# Patient Record
Sex: Female | Born: 1990 | Race: Black or African American | Hispanic: No | Marital: Single | Smoking: Never smoker
Health system: Southern US, Community
[De-identification: ages and names within clinical notes are randomized; demographics above are authoritative.]

## PROBLEM LIST (undated history)

## (undated) ENCOUNTER — Inpatient Hospital Stay (HOSPITAL_COMMUNITY): Payer: Self-pay

## (undated) DIAGNOSIS — J329 Chronic sinusitis, unspecified: Secondary | ICD-10-CM

## (undated) DIAGNOSIS — A749 Chlamydial infection, unspecified: Secondary | ICD-10-CM

## (undated) DIAGNOSIS — N12 Tubulo-interstitial nephritis, not specified as acute or chronic: Secondary | ICD-10-CM

## (undated) DIAGNOSIS — Z789 Other specified health status: Secondary | ICD-10-CM

## (undated) DIAGNOSIS — N390 Urinary tract infection, site not specified: Secondary | ICD-10-CM

## (undated) HISTORY — PX: NO PAST SURGERIES: SHX2092

---

## 2011-06-25 ENCOUNTER — Encounter: Payer: Self-pay | Admitting: Family Medicine

## 2011-06-25 ENCOUNTER — Emergency Department (HOSPITAL_BASED_OUTPATIENT_CLINIC_OR_DEPARTMENT_OTHER)
Admission: EM | Admit: 2011-06-25 | Discharge: 2011-06-25 | Disposition: A | Discharge status: Home / Self Care | Payer: Self-pay | Attending: Emergency Medicine | Admitting: Emergency Medicine

## 2011-06-25 DIAGNOSIS — J069 Acute upper respiratory infection, unspecified: Secondary | ICD-10-CM | POA: Insufficient documentation

## 2011-06-25 DIAGNOSIS — R51 Headache: Secondary | ICD-10-CM | POA: Insufficient documentation

## 2011-06-25 HISTORY — DX: Chronic sinusitis, unspecified: J32.9

## 2011-06-25 MED ORDER — CETIRIZINE-PSEUDOEPHEDRINE ER 5-120 MG PO TB12: 1.0000 | ORAL_TABLET | Freq: Two times a day (BID) | ORAL | Status: DC

## 2011-06-25 NOTE — ED Notes (Signed)
Pt. In no distress.

## 2011-06-25 NOTE — ED Notes (Signed)
Pt c/o "sinus pressure and burning" since Tuesday night. Pt sts she took tylenol sinus at "nasal spray" at home without relief. Pt reports h/o sinus infection.

## 2011-06-25 NOTE — ED Provider Notes (Signed)
History     CSN: [id number] Arrival date & time: 06/25/2011  3:32 PM  Chief Complaint  Patient presents with  . Facial Pain   HPI Comments: Pt states that she has taken tylenol sinus with mild relief  Patient is a 20 y.o. female presenting with URI. The history is provided by the patient. No language interpreter was used.  URI The primary symptoms include headaches, sore throat and cough. Primary symptoms do not include fever. The current episode started 2 days ago. This is a new problem.  Symptoms associated with the illness include facial pain, sinus pressure and congestion.    Past Medical History  Diagnosis Date  . Sinus infection     History reviewed. No pertinent past surgical history.  No family history on file.  History  Substance Use Topics  . Smoking status: Never Smoker   . Smokeless tobacco: Not on file  . Alcohol Use: No    OB History    Grav Para Term Preterm Abortions TAB SAB Ect Mult Living                  Review of Systems  Constitutional: Negative for fever.  HENT: Positive for congestion, sore throat and sinus pressure.   Respiratory: Positive for cough.   Cardiovascular: Negative.   Gastrointestinal: Negative.   Musculoskeletal: Negative.   Skin: Negative.   Neurological: Positive for headaches.    Physical Exam  BP 111/82  Pulse 103  Temp(Src) 98.1 F (36.7 C) (Oral)  Resp 16  Ht 5\' 5"  (1.651 m)  Wt 98 lb (44.453 kg)  BMI 16.31 kg/m2  SpO2 99%  Physical Exam  Nursing note and vitals reviewed. Constitutional: She appears well-developed and well-nourished.  HENT:  Head: Normocephalic and atraumatic.  Right Ear: Tympanic membrane normal.  Left Ear: Tympanic membrane normal.  Nose: Rhinorrhea present.  Mouth/Throat: Oropharynx is clear and moist.  Eyes: Pupils are equal, round, and reactive to light.  Neck: Normal range of motion. Neck supple.  Cardiovascular: Normal rate and regular rhythm.   Pulmonary/Chest: Effort normal and  breath sounds normal.  Musculoskeletal: Normal range of motion.  Neurological: She is alert.    ED Course  Procedures  MDM Will treat pt symptomatically   Medical screening examination/treatment/procedure(s) were performed by non-physician practitioner and as supervising physician I was immediately available for consultation/collaboration. Osvaldo Human, M.D.  Teressa Lower, NP 06/25/11 1543  Carleene Cooper III, MD 06/25/11 [phone]

## 2011-12-15 ENCOUNTER — Emergency Department (INDEPENDENT_AMBULATORY_CARE_PROVIDER_SITE_OTHER)
Admission: EM | Admit: 2011-12-15 | Discharge: 2011-12-15 | Disposition: A | Discharge status: Home / Self Care | Payer: Medicaid Other | Source: Home / Self Care | Attending: Emergency Medicine | Admitting: Emergency Medicine

## 2011-12-15 ENCOUNTER — Encounter (HOSPITAL_COMMUNITY): Payer: Self-pay | Admitting: Emergency Medicine

## 2011-12-15 DIAGNOSIS — N390 Urinary tract infection, site not specified: Secondary | ICD-10-CM

## 2011-12-15 HISTORY — DX: Chlamydial infection, unspecified: A74.9

## 2011-12-15 HISTORY — DX: Urinary tract infection, site not specified: N39.0

## 2011-12-15 HISTORY — DX: Tubulo-interstitial nephritis, not specified as acute or chronic: N12

## 2011-12-15 LAB — POCT URINALYSIS DIP (DEVICE)
Bilirubin Urine: NEGATIVE
Glucose, UA: NEGATIVE mg/dL
Ketones, ur: NEGATIVE mg/dL
Protein, ur: NEGATIVE mg/dL
Specific Gravity, Urine: 1.02 (ref 1.005–1.030)

## 2011-12-15 LAB — POCT PREGNANCY, URINE: Preg Test, Ur: NEGATIVE

## 2011-12-15 MED ORDER — SULFAMETHOXAZOLE-TRIMETHOPRIM 800-160 MG PO TABS: 1.0000 | ORAL_TABLET | Freq: Two times a day (BID) | ORAL | Status: AC

## 2011-12-15 MED ORDER — PHENAZOPYRIDINE HCL 200 MG PO TABS: 200.0000 mg | ORAL_TABLET | Freq: Three times a day (TID) | ORAL | Status: AC | PRN

## 2011-12-15 NOTE — Discharge Instructions (Signed)
Make sure you drink extra fluids. Return from sexual intercourse until your symptoms have resolved. Return to the ER if you've a fever above 100.4, if you do not get better after finishing the antibiotics, or for any other concerns.  Urinary Tract Infection Infections of the urinary tract can start in several places. A bladder infection (cystitis), a kidney infection (pyelonephritis), and a prostate infection (prostatitis) are different types of urinary tract infections (UTIs). They usually get better if treated with medicines (antibiotics) that kill germs. Take all the medicine until it is gone. You or your child may feel better in a few days, but TAKE ALL MEDICINE or the infection may not respond and may become more difficult to treat. HOME CARE INSTRUCTIONS   Drink enough water and fluids to keep the urine clear or pale yellow. Cranberry juice is especially recommended, in addition to large amounts of water.   Avoid caffeine, tea, and carbonated beverages. They tend to irritate the bladder.   Alcohol may irritate the prostate.   Only take over-the-counter or prescription medicines for pain, discomfort, or fever as directed by your caregiver.  To prevent further infections:  Empty the bladder often. Avoid holding urine for long periods of time.   After a bowel movement, women should cleanse from front to back. Use each tissue only once.   Empty the bladder before and after sexual intercourse.  FINDING OUT THE RESULTS OF YOUR TEST Not all test results are available during your visit. If your or your child's test results are not back during the visit, make an appointment with your caregiver to find out the results. Do not assume everything is normal if you have not heard from your caregiver or the medical facility. It is important for you to follow up on all test results. SEEK MEDICAL CARE IF:   There is back pain.   Your baby is older than 3 months with a rectal temperature of 100.5 F  (38.1 C) or higher for more than 1 day.   Your or your child's problems (symptoms) are no better in 3 days. Return sooner if you or your child is getting worse.  SEEK IMMEDIATE MEDICAL CARE IF:   There is severe back pain or lower abdominal pain.   You or your child develops chills.   You have a fever.   Your baby is older than 3 months with a rectal temperature of 102 F (38.9 C) or higher.   Your baby is 29 months old or younger with a rectal temperature of 100.4 F (38 C) or higher.   There is nausea or vomiting.   There is continued burning or discomfort with urination.  MAKE SURE YOU:   Understand these instructions.   Will watch your condition.   Will get help right away if you are not doing well or get worse.  Document Released: 07/29/2005 Document Revised: 07/01/2011 Document Reviewed: 03/03/2007 [facility] Patient Information 2012 [place], [place].

## 2011-12-15 NOTE — ED Notes (Signed)
C/o frequent urination and pressure and pain in lower back.  Onset of symptoms approx mid January, was taking azo and cranberry  juice.

## 2011-12-15 NOTE — ED Provider Notes (Signed)
History     CSN: [id number]  Arrival date & time 12/15/11  1701   First MD Initiated Contact with Patient 12/15/11 1904      Chief Complaint  Patient presents with  . Dysuria    (Consider location/radiation/quality/duration/timing/severity/associated sxs/prior treatment) HPI Comments: Patient with urinary urgency, frequency, oderous urine, for several weeks. Describes left-sided lower back pain starting several days ago. Has increased her fluids, and is taking azo without relief. No nausea, vomiting, vaginal bleeding, vaginal discharge, fevers. Patient sexually active with partner of 2 months, states that they use condoms on a consistent basis. STDs not a concern today-patient states that she was recently checked and "everything was negative". States this feels identical to previous episodes of UTIs. Has a history of Chlamydia. No history of gonorrhea, BV, trichomoniasis, herpes, HIV, syphilis.  ROS as noted in HPI. All other ROS negative.   Patient is a 21 y.o. female presenting with dysuria. The history is provided by the patient.  Dysuria  This is a new problem. The current episode started more than 2 days ago. The problem occurs every urination. The problem has been gradually worsening. There has been no fever. She is sexually active. There is a history of pyelonephritis. Associated symptoms include frequency and urgency. Pertinent negatives include no chills, no sweats, no nausea, no vomiting, no discharge, no hematuria, no hesitancy, no possible pregnancy and no flank pain. She has tried increased fluids for the symptoms.    Past Medical History  Diagnosis Date  . Sinus infection   . UTI (lower urinary tract infection)   . Pyelonephritis   . Chlamydia     History reviewed. No pertinent past surgical history.  History reviewed. No pertinent family history.  History  Substance Use Topics  . Smoking status: Never Smoker   . Smokeless tobacco: Not on file  . Alcohol Use: No     OB History    Grav Para Term Preterm Abortions TAB SAB Ect Mult Living                  Review of Systems  Constitutional: Negative for chills.  Gastrointestinal: Negative for nausea and vomiting.  Genitourinary: Positive for dysuria, urgency and frequency. Negative for hesitancy, hematuria and flank pain.    Allergies  Review of patient's allergies indicates no known allergies.  Home Medications   Current Outpatient Rx  Name Route Sig Dispense Refill  . AZO-CRANBERRY PO Oral Take by mouth.    Marland Kitchen PHENAZOPYRIDINE HCL 200 MG PO TABS Oral Take 1 tablet (200 mg total) by mouth 3 (three) times daily as needed for pain. 6 tablet 0  . SULFAMETHOXAZOLE-TRIMETHOPRIM 800-160 MG PO TABS Oral Take 1 tablet by mouth 2 (two) times daily. X 7 days 14 tablet 0    BP 109/70  Pulse 77  Temp(Src) 98.9 F (37.2 C) (Oral)  Resp 16  SpO2 100%  Physical Exam  Nursing note and vitals reviewed. Constitutional: She is oriented to person, place, and time. She appears well-developed and well-nourished. No distress.  HENT:  Head: Normocephalic and atraumatic.  Eyes: Conjunctivae and EOM are normal.  Neck: Normal range of motion.  Cardiovascular: Normal rate, regular rhythm and normal heart sounds.   Pulmonary/Chest: Effort normal and breath sounds normal.  Abdominal: Soft. Bowel sounds are normal. She exhibits no distension. There is no tenderness. There is no rebound.       Mild left-sided CVA tenderness. No flank, suprapubic tenderness.  Musculoskeletal: Normal range of motion.  Neurological: She is alert and oriented to person, place, and time.  Skin: Skin is warm and dry.  Psychiatric: She has a normal mood and affect. Her behavior is normal. Judgment and thought content normal.    ED Course  Procedures (including critical care time)  Labs Reviewed  POCT URINALYSIS DIP (DEVICE) - Abnormal; Notable for the following:    Urobilinogen, UA 2.0 (*)    Leukocytes, UA SMALL (*) Biochemical  Testing Only. Please order routine urinalysis from main lab if confirmatory testing is needed.   All other components within normal limits  POCT PREGNANCY, URINE   No results found.   1. UTI (lower urinary tract infection)     Results for orders placed during the hospital encounter of 12/15/11  POCT URINALYSIS DIP (DEVICE)      Component Value Range   Glucose, UA NEGATIVE  NEGATIVE (mg/dL)   Bilirubin Urine NEGATIVE  NEGATIVE    Ketones, ur NEGATIVE  NEGATIVE (mg/dL)   Specific Gravity, Urine 1.020  1.005 - 1.030    Hgb urine dipstick NEGATIVE  NEGATIVE    pH 7.0  5.0 - 8.0    Protein, ur NEGATIVE  NEGATIVE (mg/dL)   Urobilinogen, UA 2.0 (*) 0.0 - 1.0 (mg/dL)   Nitrite NEGATIVE  NEGATIVE    Leukocytes, UA SMALL (*) NEGATIVE   POCT PREGNANCY, URINE      Component Value Range   Preg Test, Ur NEGATIVE  NEGATIVE     MDM  H&P most c/w  UTI. Will send home  abx for uti - home with extended antibiotic course as patient has mild CVA tenderness, and history of pyelonephritis. Advised pt to refrain from sexual contact until  symptoms resolve.  Pt agrees.     Luiz Blare, MD 12/15/11 2158

## 2012-01-27 ENCOUNTER — Ambulatory Visit (INDEPENDENT_AMBULATORY_CARE_PROVIDER_SITE_OTHER): Payer: Medicaid Other | Admitting: Obstetrics and Gynecology

## 2012-01-27 DIAGNOSIS — Z00129 Encounter for routine child health examination without abnormal findings: Secondary | ICD-10-CM

## 2012-01-27 DIAGNOSIS — N760 Acute vaginitis: Secondary | ICD-10-CM

## 2012-08-25 ENCOUNTER — Emergency Department (HOSPITAL_COMMUNITY)
Admission: EM | Admit: 2012-08-25 | Discharge: 2012-08-25 | Disposition: A | Discharge status: Home / Self Care | Payer: Medicaid Other | Attending: Emergency Medicine | Admitting: Emergency Medicine

## 2012-08-25 ENCOUNTER — Emergency Department (HOSPITAL_COMMUNITY): Payer: Medicaid Other

## 2012-08-25 DIAGNOSIS — Z8619 Personal history of other infectious and parasitic diseases: Secondary | ICD-10-CM | POA: Insufficient documentation

## 2012-08-25 DIAGNOSIS — R05 Cough: Secondary | ICD-10-CM

## 2012-08-25 DIAGNOSIS — R059 Cough, unspecified: Secondary | ICD-10-CM | POA: Insufficient documentation

## 2012-08-25 DIAGNOSIS — J302 Other seasonal allergic rhinitis: Secondary | ICD-10-CM

## 2012-08-25 DIAGNOSIS — Z87448 Personal history of other diseases of urinary system: Secondary | ICD-10-CM | POA: Insufficient documentation

## 2012-08-25 DIAGNOSIS — Z87440 Personal history of urinary (tract) infections: Secondary | ICD-10-CM | POA: Insufficient documentation

## 2012-08-25 DIAGNOSIS — J301 Allergic rhinitis due to pollen: Secondary | ICD-10-CM | POA: Insufficient documentation

## 2012-08-25 DIAGNOSIS — G8929 Other chronic pain: Secondary | ICD-10-CM | POA: Insufficient documentation

## 2012-08-25 NOTE — ED Provider Notes (Signed)
History  Scribed for Jones Skene, MD, the patient was seen in room TR09C/TR09C. This chart was scribed by Candelaria Stagers. The patient's care started at 12:19 PM   CSN: [id number]  Arrival date & time 08/25/12  1138   First MD Initiated Contact with Patient 08/25/12 1159      Chief Complaint  Patient presents with  . Chest Pain     The history is provided by the patient. No language interpreter was used.   Hannah Fuller is a 21 y.o. female who presents to the Emergency Department complaining of chest pain under the left breast that started yesterday.  Pt states that she has had intermittent chest pain and tightness that started about two years ago.  She describes the pain as a sharp tightness that is worse first thing in the morning when lying down.  She denies birth control, SOB, blood clots.  She has also experienced a productive cough that started four days ago as well as burning of the nose.  Pt was checked for heart murmurs with negative results.  She has taken zyrtec with no relief.  She has no h/o pneumothorax.       Past Medical History  Diagnosis Date  . Sinus infection   . UTI (lower urinary tract infection)   . Pyelonephritis   . Chlamydia     No past surgical history on file.  No family history on file.  History  Substance Use Topics  . Smoking status: Never Smoker   . Smokeless tobacco: Not on file  . Alcohol Use: No    OB History    Grav Para Term Preterm Abortions TAB SAB Ect Mult Living                  Review of Systems  Cardiovascular: Positive for chest pain.   A complete 10 system review of systems was obtained and all systems are negative except as noted in the HPI and PMH.   Allergies  Review of patient's allergies indicates no known allergies.  Home Medications   Current Outpatient Rx  Name Route Sig Dispense Refill  . CETIRIZINE HCL 10 MG PO TABS Oral Take 10 mg by mouth daily.      BP 120/82  Pulse 69  Temp 97.4 F (36.3 C)  (Oral)  Resp 16  Ht 5\' 4"  (1.626 m)  Wt 106 lb (48.081 kg)  BMI 18.19 kg/m2  SpO2 99%  LMP 08/12/2012  Physical Exam  Nursing notes reviewed.  Electronic medical record reviewed. VITAL SIGNS:   Filed Vitals:   08/25/12 1141 08/25/12 1142  BP:  120/82  Pulse:  69  Temp:  97.4 F (36.3 C)  TempSrc:  Oral  Resp:  16  Height: 5\' 4"  (1.626 m)   Weight: 106 lb (48.081 kg)   SpO2:  99%   CONSTITUTIONAL: Awake, oriented, appears non-toxic HENT: Atraumatic, normocephalic, oral mucosa pink and moist, airway patent. Nares patent without drainage. External ears normal. EYES: Conjunctiva clear, EOMI, PERRLA NECK: Trachea midline, non-tender, supple CARDIOVASCULAR: Normal heart rate, Normal rhythm, No murmurs, rubs, gallops PULMONARY/CHEST: Clear to auscultation, no rhonchi, wheezes, or rales. Symmetrical breath sounds.  Reproducible chest wall pain between the left midaxillary line and the left midclavicular line about the left breast. ABDOMINAL: Non-distended, soft, non-tender - no rebound or guarding.  BS normal. NEUROLOGIC: Non-focal, moving all four extremities, no gross sensory or motor deficits. EXTREMITIES: No clubbing, cyanosis, or edema SKIN: Warm, Dry, No erythema, No rash  ED Course  Procedures   Date: 08/25/2012  Rate: 68  Rhythm: normal sinus rhythm  QRS Axis: normal  Intervals: normal  ST/T Wave abnormalities: normal  Conduction Disutrbances: none  Narrative Interpretation: Sinus arrhythmia, no malignant arrhythmia, no ST or T wave abnormality consistent with ischemia or production  DIAGNOSTIC STUDIES:   COORDINATION OF CARE:  11:43 Ordered: DG Chest 2 View;   11:59 Ordered: ED EKG  12:26 PM Discussed possible virus and continuation   Labs Reviewed - No data to display Dg Chest 2 View  08/25/2012  *RADIOLOGY REPORT*  Clinical Data: Chest pain, cough  CHEST - 2 VIEW  Comparison: None  Findings: Normal heart size, mediastinal contours, and pulmonary  vascularity. Lungs mildly hyperexpanded but clear. No pleural effusion or pneumothorax. Bones unremarkable.  IMPRESSION: Mildly hyperexpanded lungs without acute infiltrate.   Original Report Authenticated By: Lollie Marrow, M.D.      1. Chronic chest wall pain   2. Cough   3. Seasonal allergies       MDM  Hannah Fuller is a 21 y.o. female presenting with chest wall pain. EKG is normal, she does have a cough but she also has some sinus drainage evident from boggy turbinates and discharge in the nose. Tell her to continue taking her Zyrtec. No abnormalities on chest x-ray - no pneumonia, pneumothorax.  I explained the diagnosis and have given explicit precautions to return to the ER including worsening pain or any other new or worsening symptoms. The patient understands and accepts the medical plan as it's been dictated and I have answered their questions. Discharge instructions concerning home care and prescriptions have been given.  The patient is STABLE and is discharged to home in good condition.  I personally performed the services described in this documentation, which was scribed in my presence. The recorded information has been reviewed and considered. Jones Skene, M.D.     Jones Skene, MD 08/25/12 2131

## 2012-08-25 NOTE — ED Notes (Signed)
No old EKG.

## 2012-08-25 NOTE — ED Notes (Signed)
Patient reports onset of chest pain on yesterday.  She denies sob, denies birth control, and denies recent travel.  She denies any hx of trauma.  Patient states her chest is more pain with cough.  She has had a cough for 4 days.  She denies fever. Patient reports she has yellow productive cough

## 2013-01-28 ENCOUNTER — Emergency Department (HOSPITAL_COMMUNITY)
Admission: EM | Admit: 2013-01-28 | Discharge: 2013-01-29 | Disposition: A | Discharge status: Home / Self Care | Payer: BC Managed Care – PPO | Attending: Emergency Medicine | Admitting: Emergency Medicine

## 2013-01-28 DIAGNOSIS — Z8709 Personal history of other diseases of the respiratory system: Secondary | ICD-10-CM | POA: Insufficient documentation

## 2013-01-28 DIAGNOSIS — Z3202 Encounter for pregnancy test, result negative: Secondary | ICD-10-CM | POA: Insufficient documentation

## 2013-01-28 DIAGNOSIS — R110 Nausea: Secondary | ICD-10-CM | POA: Insufficient documentation

## 2013-01-28 DIAGNOSIS — Z87440 Personal history of urinary (tract) infections: Secondary | ICD-10-CM | POA: Insufficient documentation

## 2013-01-28 DIAGNOSIS — Z8619 Personal history of other infectious and parasitic diseases: Secondary | ICD-10-CM | POA: Insufficient documentation

## 2013-01-28 DIAGNOSIS — Z87448 Personal history of other diseases of urinary system: Secondary | ICD-10-CM | POA: Insufficient documentation

## 2013-01-28 LAB — URINE MICROSCOPIC-ADD ON

## 2013-01-28 LAB — POCT I-STAT, CHEM 8
Chloride: 105 mEq/L (ref 96–112)
HCT: 38 % (ref 36.0–46.0)
Hemoglobin: 12.9 g/dL (ref 12.0–15.0)
Potassium: 3.9 mEq/L (ref 3.5–5.1)
Sodium: 141 mEq/L (ref 135–145)

## 2013-01-28 LAB — URINALYSIS, ROUTINE W REFLEX MICROSCOPIC
Nitrite: NEGATIVE
Specific Gravity, Urine: 1.024 (ref 1.005–1.030)
pH: 6.5 (ref 5.0–8.0)

## 2013-01-28 MED ORDER — ONDANSETRON 4 MG PO TBDP
8.0000 mg | ORAL_TABLET | Freq: Once | ORAL | Status: AC
  Administered 2013-01-28: 8 mg via ORAL
  Filled 2013-01-28: qty 2

## 2013-01-28 NOTE — ED Provider Notes (Signed)
History     CSN: [id number]  Arrival date & time 01/28/13  2121   First MD Initiated Contact with Patient 01/28/13 2227      Chief Complaint  Patient presents with  . Nausea   HPI  History provided by the patient. Patient is a 22 year old female with history of nephritis and STDs in the past who presents with complaints of 3 days of nausea. She reports having "stomach flu" last weekend with 24 hours nausea vomiting diarrhea last week. This resolved and she improved but has had some nausea for the past 3 days. She denies any other associated symptoms there is no aggravating or alleviating factors. She has not used any medications for her symptoms. No fever, chills or sweats. No vomiting, diarrhea constipation. No abdominal pain. No dysuria, hematuria urinary frequency. No vaginal bleeding or vaginal discharge.    Past Medical History  Diagnosis Date  . Sinus infection   . UTI (lower urinary tract infection)   . Pyelonephritis   . Chlamydia     No past surgical history on file.  No family history on file.  History  Substance Use Topics  . Smoking status: Never Smoker   . Smokeless tobacco: Not on file  . Alcohol Use: No    OB History   Grav Para Term Preterm Abortions TAB SAB Ect Mult Living                  Review of Systems  Constitutional: Positive for appetite change. Negative for fever, chills and diaphoresis.  Respiratory: Negative for cough and shortness of breath.   Cardiovascular: Negative for chest pain.  Gastrointestinal: Positive for nausea. Negative for vomiting, abdominal pain, diarrhea and constipation.  Genitourinary: Negative for dysuria, frequency, hematuria, flank pain, vaginal bleeding, vaginal discharge, menstrual problem and pelvic pain.  All other systems reviewed and are negative.    Allergies  Review of patient's allergies indicates no known allergies.  Home Medications  No current outpatient prescriptions on file.  BP 108/75  Pulse  78  Temp(Src) 98.4 F (36.9 C) (Oral)  Resp 14  SpO2 100%  LMP 01/28/2013  Physical Exam  Nursing note and vitals reviewed. Constitutional: She is oriented to person, place, and time. She appears well-developed and well-nourished. No distress.  HENT:  Head: Normocephalic.  Cardiovascular: Normal rate and regular rhythm.   Pulmonary/Chest: Effort normal and breath sounds normal.  Abdominal: Soft. There is no tenderness. There is no rebound and no guarding.  Musculoskeletal: Normal range of motion.  Neurological: She is alert and oriented to person, place, and time.  Skin: Skin is warm and dry. No rash noted.  Psychiatric: She has a normal mood and affect. Her behavior is normal.    ED Course  Procedures   Results for orders placed during the hospital encounter of 01/28/13  URINALYSIS, ROUTINE W REFLEX MICROSCOPIC      Result Value Range   Color, Urine YELLOW  YELLOW   APPearance CLEAR  CLEAR   Specific Gravity, Urine 1.024  1.005 - 1.030   pH 6.5  5.0 - 8.0   Glucose, UA NEGATIVE  NEGATIVE mg/dL   Hgb urine dipstick TRACE (*) NEGATIVE   Bilirubin Urine NEGATIVE  NEGATIVE   Ketones, ur 15 (*) NEGATIVE mg/dL   Protein, ur NEGATIVE  NEGATIVE mg/dL   Urobilinogen, UA 1.0  0.0 - 1.0 mg/dL   Nitrite NEGATIVE  NEGATIVE   Leukocytes, UA TRACE (*) NEGATIVE  URINE MICROSCOPIC-ADD ON  Result Value Range   Squamous Epithelial / LPF FEW (*) RARE   WBC, UA 0-2  <3 WBC/hpf   RBC / HPF 0-2  <3 RBC/hpf   Bacteria, UA FEW (*) RARE  PREGNANCY, URINE      Result Value Range   Preg Test, Ur NEGATIVE  NEGATIVE  POCT I-STAT, CHEM 8      Result Value Range   Sodium 141  135 - 145 mEq/L   Potassium 3.9  3.5 - 5.1 mEq/L   Chloride 105  96 - 112 mEq/L   BUN 7  6 - 23 mg/dL   Creatinine, Ser 1.61  0.50 - 1.10 mg/dL   Glucose, Bld 87  70 - 99 mg/dL   Calcium, Ion 0.96  0.45 - 1.23 mmol/L   TCO2 26  0 - 100 mmol/L   Hemoglobin 12.9  12.0 - 15.0 g/dL   HCT 40.9  81.1 - 91.4 %        1. Nausea       MDM  Pt seen and evaluated.  Patient appears well in no acute distress.  Patient feeling better after medications. She is tolerating by mouth fluids. No concerning lab findings. No other concerning symptoms at this time. Patient stable to discharge home with symptomatic treatment.      Angus Seller, PA-C 01/29/13 2037

## 2013-01-28 NOTE — ED Notes (Signed)
X 3 days been getting sick throughout the entire day..feels weak at times. Only nauseated no emesis.

## 2013-01-29 MED ORDER — PROMETHAZINE HCL 25 MG PO TABS: 25.0000 mg | ORAL_TABLET | Freq: Four times a day (QID) | ORAL | Status: DC | PRN

## 2013-01-29 MED ORDER — ONDANSETRON 8 MG PO TBDP: ORAL_TABLET | ORAL | Status: DC

## 2013-01-30 NOTE — ED Provider Notes (Signed)
Medical screening examination/treatment/procedure(s) were performed by non-physician practitioner and as supervising physician I was immediately available for consultation/collaboration.  Brandt Loosen, MD 01/30/13 [phone]

## 2013-02-16 ENCOUNTER — Encounter (HOSPITAL_COMMUNITY): Payer: Self-pay | Admitting: *Deleted

## 2013-02-16 ENCOUNTER — Emergency Department (INDEPENDENT_AMBULATORY_CARE_PROVIDER_SITE_OTHER)
Admission: EM | Admit: 2013-02-16 | Discharge: 2013-02-16 | Disposition: A | Discharge status: Home / Self Care | Payer: BC Managed Care – PPO | Source: Home / Self Care | Attending: Family Medicine | Admitting: Family Medicine

## 2013-02-16 DIAGNOSIS — J309 Allergic rhinitis, unspecified: Secondary | ICD-10-CM

## 2013-02-16 MED ORDER — FLUTICASONE PROPIONATE 50 MCG/ACT NA SUSP: 2.0000 | Freq: Every day | NASAL | Status: DC

## 2013-02-16 MED ORDER — PREDNISONE 20 MG PO TABS: ORAL_TABLET | ORAL | Status: DC

## 2013-02-16 MED ORDER — IBUPROFEN 600 MG PO TABS: 600.0000 mg | ORAL_TABLET | Freq: Three times a day (TID) | ORAL | Status: DC | PRN

## 2013-02-16 MED ORDER — CETIRIZINE-PSEUDOEPHEDRINE ER 5-120 MG PO TB12: 1.0000 | ORAL_TABLET | Freq: Two times a day (BID) | ORAL | Status: DC

## 2013-02-16 NOTE — ED Notes (Signed)
Pt    Reports  Symptoms  Of  Stuffy  Nose   And  Congestion   She  Reports  Symptoms  X  3  Days      She  Reports  Symptoms    Of  Headache  As  Well         She  Is  Sitting  Upright on  Exam table  Speaking in  Complete  sentances

## 2013-02-16 NOTE — ED Provider Notes (Signed)
History     CSN: [id number]  Arrival date & time 02/16/13  1002   First MD Initiated Contact with Patient 02/16/13 1003      Chief Complaint  Patient presents with  . URI    (Consider location/radiation/quality/duration/timing/severity/associated sxs/prior treatment) HPI Comments: 22 year old female with history of seasonal allergies. Here complaining of nasal congestion, sinus pressure, postnasal drip and clear rhinorrhea for 3 days. Symptoms associated with frontal headache, sneezing and eye itchiness. Denies fever or chills. Normal appetite. No nausea or vomiting. No sore throat. No cough or chest pain. Patient taking Claritin with minimal improvement.   Past Medical History  Diagnosis Date  . Sinus infection   . UTI (lower urinary tract infection)   . Pyelonephritis   . Chlamydia     History reviewed. No pertinent past surgical history.  No family history on file.  History  Substance Use Topics  . Smoking status: Never Smoker   . Smokeless tobacco: Not on file  . Alcohol Use: No    OB History   Grav Para Term Preterm Abortions TAB SAB Ect Mult Living                  Review of Systems  Constitutional: Negative for fever, chills, diaphoresis, appetite change and fatigue.  HENT: Positive for congestion, rhinorrhea, sneezing, postnasal drip and sinus pressure. Negative for sore throat and trouble swallowing.   Eyes: Positive for itching.  Respiratory: Negative for cough, chest tightness, shortness of breath and wheezing.   Cardiovascular: Negative for chest pain.  Gastrointestinal: Negative for nausea, vomiting, abdominal pain and diarrhea.  Skin: Negative for rash.  Neurological: Positive for headaches.  All other systems reviewed and are negative.    Allergies  Review of patient's allergies indicates no known allergies.  Home Medications   Current Outpatient Rx  Name  Route  Sig  Dispense  Refill  . cetirizine-pseudoephedrine (ZYRTEC-D) 5-120 MG per  tablet   Oral   Take 1 tablet by mouth 2 (two) times daily.   30 tablet   0   . fluticasone (FLONASE) 50 MCG/ACT nasal spray   Nasal   Place 2 sprays into the nose daily.   16 g   0   . ibuprofen (ADVIL,MOTRIN) 600 MG tablet   Oral   Take 1 tablet (600 mg total) by mouth every 8 (eight) hours as needed for pain.   20 tablet   0   . predniSONE (DELTASONE) 20 MG tablet      2 tabs by mouth daily for 5 days   10 tablet   0     BP 102/70  Pulse 72  Temp(Src) 98.6 F (37 C) (Oral)  Resp 16  SpO2 100%  LMP 01/28/2013  Physical Exam  Nursing note and vitals reviewed. Constitutional: She is oriented to person, place, and time. She appears well-developed and well-nourished. No distress.  HENT:  Head: Normocephalic and atraumatic.  Nasal Congestion with erythema and swelling of nasal turbinates, clear rhinorrhea. Post nasal drip, mild pharyngeal erythema no exudates. No uvula deviation. No trismus. TM's  and normal   Eyes: Conjunctivae are normal. Right eye exhibits no discharge. Left eye exhibits no discharge.  Neck: Neck supple.  Cardiovascular: Normal heart sounds.   Pulmonary/Chest: Breath sounds normal.  Lymphadenopathy:    She has no cervical adenopathy.  Neurological: She is alert and oriented to person, place, and time.  Skin: No rash noted. She is not diaphoretic.    ED Course  Procedures (including critical care time)  Labs Reviewed - No data to display No results found.   1. Allergic rhinosinusitis       MDM  Treated with prednisone, Flonase and cetirizine/pseudoephedrine. Supportive care and red flags should prompt her return to medical attention discussed with patient and provided in writing.       Sharin Grave, MD 02/16/13 1050

## 2013-12-07 ENCOUNTER — Encounter (HOSPITAL_COMMUNITY): Payer: Self-pay | Admitting: Emergency Medicine

## 2013-12-07 ENCOUNTER — Emergency Department (HOSPITAL_COMMUNITY)
Admission: EM | Admit: 2013-12-07 | Discharge: 2013-12-08 | Disposition: A | Discharge status: Home / Self Care | Payer: Medicaid Other | Attending: Emergency Medicine | Admitting: Emergency Medicine

## 2013-12-07 DIAGNOSIS — K589 Irritable bowel syndrome without diarrhea: Secondary | ICD-10-CM | POA: Insufficient documentation

## 2013-12-07 DIAGNOSIS — R109 Unspecified abdominal pain: Secondary | ICD-10-CM

## 2013-12-07 DIAGNOSIS — A749 Chlamydial infection, unspecified: Secondary | ICD-10-CM | POA: Insufficient documentation

## 2013-12-07 DIAGNOSIS — R1033 Periumbilical pain: Secondary | ICD-10-CM | POA: Insufficient documentation

## 2013-12-07 DIAGNOSIS — J329 Chronic sinusitis, unspecified: Secondary | ICD-10-CM | POA: Insufficient documentation

## 2013-12-07 DIAGNOSIS — N12 Tubulo-interstitial nephritis, not specified as acute or chronic: Secondary | ICD-10-CM | POA: Insufficient documentation

## 2013-12-07 DIAGNOSIS — R209 Unspecified disturbances of skin sensation: Secondary | ICD-10-CM | POA: Insufficient documentation

## 2013-12-07 DIAGNOSIS — R197 Diarrhea, unspecified: Secondary | ICD-10-CM | POA: Insufficient documentation

## 2013-12-07 DIAGNOSIS — N390 Urinary tract infection, site not specified: Secondary | ICD-10-CM | POA: Insufficient documentation

## 2013-12-07 LAB — LIPASE, BLOOD: Lipase: 38 U/L (ref 11–59)

## 2013-12-07 LAB — URINALYSIS, ROUTINE W REFLEX MICROSCOPIC
Bilirubin Urine: NEGATIVE
GLUCOSE, UA: NEGATIVE mg/dL
KETONES UR: NEGATIVE mg/dL
Leukocytes, UA: NEGATIVE
Nitrite: NEGATIVE
PROTEIN: NEGATIVE mg/dL
Specific Gravity, Urine: 1.03 (ref 1.005–1.030)
Urobilinogen, UA: 0.2 mg/dL (ref 0.0–1.0)
pH: 5.5 (ref 5.0–8.0)

## 2013-12-07 LAB — URINE MICROSCOPIC-ADD ON

## 2013-12-07 LAB — CBC WITH DIFFERENTIAL/PLATELET
BASOS ABS: 0 10*3/uL (ref 0.0–0.1)
Basophils Relative: 0 % (ref 0–1)
Eosinophils Absolute: 0.1 10*3/uL (ref 0.0–0.7)
Eosinophils Relative: 1 % (ref 0–5)
HEMATOCRIT: 36.7 % (ref 36.0–46.0)
HEMOGLOBIN: 12.6 g/dL (ref 12.0–15.0)
LYMPHS ABS: 1.8 10*3/uL (ref 0.7–4.0)
LYMPHS PCT: 31 % (ref 12–46)
MCH: 33.4 pg (ref 26.0–34.0)
MCHC: 34.3 g/dL (ref 30.0–36.0)
MCV: 97.3 fL (ref 78.0–100.0)
MONO ABS: 0.4 10*3/uL (ref 0.1–1.0)
Monocytes Relative: 7 % (ref 3–12)
NEUTROS ABS: 3.5 10*3/uL (ref 1.7–7.7)
Neutrophils Relative %: 60 % (ref 43–77)
Platelets: 197 10*3/uL (ref 150–400)
RBC: 3.77 MIL/uL — AB (ref 3.87–5.11)
RDW: 12.4 % (ref 11.5–15.5)
WBC: 5.8 10*3/uL (ref 4.0–10.5)

## 2013-12-07 LAB — COMPREHENSIVE METABOLIC PANEL
ALT: 9 U/L (ref 0–35)
AST: 14 U/L (ref 0–37)
Albumin: 4.1 g/dL (ref 3.5–5.2)
Alkaline Phosphatase: 68 U/L (ref 39–117)
BILIRUBIN TOTAL: 0.5 mg/dL (ref 0.3–1.2)
BUN: 11 mg/dL (ref 6–23)
CHLORIDE: 104 meq/L (ref 96–112)
CO2: 26 meq/L (ref 19–32)
CREATININE: 0.65 mg/dL (ref 0.50–1.10)
Calcium: 9.4 mg/dL (ref 8.4–10.5)
GFR calc Af Amer: >90 mL/min (ref >90)
Glucose, Bld: 102 mg/dL — ABNORMAL HIGH (ref 70–99)
Potassium: 4.3 mEq/L (ref 3.7–5.3)
Sodium: 142 mEq/L (ref 137–147)
Total Protein: 7.6 g/dL (ref 6.0–8.3)

## 2013-12-07 LAB — POCT PREGNANCY, URINE: Preg Test, Ur: NEGATIVE

## 2013-12-07 MED ORDER — DICYCLOMINE HCL 10 MG/ML IM SOLN
20.0000 mg | Freq: Once | INTRAMUSCULAR | Status: AC
  Administered 2013-12-07: 20 mg via INTRAMUSCULAR
  Filled 2013-12-07: qty 2

## 2013-12-07 MED ORDER — ONDANSETRON 4 MG PO TBDP
8.0000 mg | ORAL_TABLET | Freq: Once | ORAL | Status: AC
  Administered 2013-12-07: 8 mg via ORAL
  Filled 2013-12-07: qty 2

## 2013-12-07 MED ORDER — ONDANSETRON 8 MG PO TBDP: 8.0000 mg | ORAL_TABLET | Freq: Three times a day (TID) | ORAL | Status: DC | PRN

## 2013-12-07 MED ORDER — DICYCLOMINE HCL 20 MG PO TABS: 20.0000 mg | ORAL_TABLET | Freq: Four times a day (QID) | ORAL | Status: DC | PRN

## 2013-12-07 NOTE — ED Notes (Signed)
PT c/o periumbilical pain that woke her up from her sleep.  Denies nausea, changes in bladder habits or vaginal discharge but does c/o diarrhea.  Hx of same and has been tx with acid blockers and "meds for tape worm" without success.  Pt states normally the pain lasts a few hours, but today the pain has not stopped.

## 2013-12-07 NOTE — Discharge Instructions (Signed)
If you were a [facility] patient or do not have insurance, here are some clinics in our community that may be able to provide care for free or on sliding payment scales.  [facility], [address], [place], [phone]; Monday to Friday, 9 a.m. - 7 p.m.; Saturday 9 a.m. to 1 p.m.   [facility], [place] [place]; [phone]; or [address]  [place]; [phone].    [facility] [place], [place] [address] [place]; [phone]; Monday to Wednesday, 8:30 a.m. - 5 p.m.; Thursday, 8:30 a.m. - 8 p.m.   [facility], [place], [place]; [phone]; Monday to Friday, 8 a.m. - 4:30 p.m.    [facility], [place] [address], [place], [place]; first and third Saturday of the  month, 9:30 a.m. - 12:30 p.m.   [facility], [address], [place], [place]; second Saturday of the month, 9 a.m. -noon.   RESOURCE GUIDE  Dental Problems  Patients with Medicaid: [place] Phone:  [phone]                                                                             Phone:  [phone]  If unable to pay or uninsured, contact:  [facility] or [facility]. to become qualified for the adult dental clinic.  Chronic Pain Problems Contact [facility]  [phone] Patients need to be referred by their primary care doctor.  Insufficient Money for Medicine Contact [employer]:  call "211" or [place] [phone].  No Primary Care Doctor Call [facility]  [phone] Other agencies that provide inexpensive medical care    [place]  [phone]    [facility]  [phone]    [facility]  [phone]     [facility]  [place]  [phone] [facility]  [phone] [place]   [phone] (emergency services [phone])  Abuse/Neglect [place] [phone] [place] [phone] (After Hours)  Emergency [place] [phone]  [place] [facility] [place] [phone] [place] [phone]  MRSA Hotline #:   613-623-2908  [facility] [place]  [facility] [address] [place]                        [address]          [address]  [place]                                                [place]                            [place] Phone:  [phone]                                     Phone:  [phone]                   Phone:  [phone]  [facility] Phone:  [phone]  [facility] Child Abuse Hotline 803-031-9772(336) [phone](336) [phone] (After Hours)   Community Resources: *IF YOU ARE IN IMMEDIATE DANGER CALL 911!  Abuse/Neglect:  Family Services Crisis Hotline [facility]([place]): [phone](336) [phone] Center Against Violence [facility]([place]): [phone](336) [phone]  After hours, holidays and weekends: [phone](336) [phone] National Domestic Violence Hotline: 437-646-3121630-138-2358  Mental Health: [facility]: [address]: [phone](336) [phone]  Health Clinics:  [facility]): [phone](336) [phone] Monday - Friday 8 AM - 9 PM, Saturday and Sunday 10 AM - 9 PM   [facility]  [address]: [phone](336) [phone] Monday- Friday 8:30 AM - 5:30 PM, Sat 9 AM - 1 PM  [facility] [address] [facility] [address]: [phone](336) [phone] [facility]: [phone](336) [phone] [facility] [address]: [phone](336)  [phone]  [facility]: [address] [phone]) [phone]   Abdominal Pain, Adult Many things can cause abdominal pain. Usually, abdominal pain is not caused by a disease and will improve without treatment. It can often be observed and treated at home. Your health care provider will do a physical exam and possibly order blood tests and X-rays to help determine the seriousness of your pain. However, in many cases, more time must pass before a clear cause of the pain can be found. Before that point, your health care provider may not know if you need more testing or further treatment. HOME CARE INSTRUCTIONS  Monitor your abdominal pain for any changes. The following actions may help to alleviate any discomfort you are experiencing:  Only take over-the-counter or prescription medicines as directed by your health care provider.  Do not take laxatives unless directed to do so by your health care provider.  Try a clear liquid diet (broth, tea, or water) as directed by your health care provider. Slowly move to a bland diet as tolerated. SEEK MEDICAL CARE IF:  You have unexplained abdominal pain.  You have abdominal pain associated with nausea or diarrhea.  You have pain when you urinate or have a bowel movement.  You experience abdominal pain that wakes you in the night.  You have abdominal pain that is worsened or improved by eating food.  You have abdominal pain that is worsened with eating fatty foods. SEEK IMMEDIATE MEDICAL CARE IF:   Your pain  does not go away within 2 hours.  You have a fever.  You keep throwing up (vomiting).  Your pain is felt only in portions of the abdomen, such as the right side or the left lower portion of the abdomen.  You pass bloody or black tarry stools. MAKE SURE YOU:  Understand these instructions.   Will watch your condition.   Will get help right away if you are not doing well or get worse.  Document Released:  07/29/2005 Document Revised: 08/09/2013 Document Reviewed: 06/28/2013 [facility] Patient Information 2014 [place], [place].  Diarrhea Diarrhea is frequent loose and watery bowel movements. It can cause you to feel weak and dehydrated. Dehydration can cause you to become tired and thirsty, have a dry mouth, and have decreased urination that often is dark yellow. Diarrhea is a sign of another problem, most often an infection that will not last long. In most cases, diarrhea typically lasts 2 3 days. However, it can last longer if it is a sign of something more serious. It is important to treat your diarrhea as directed by your caregive to lessen or prevent future episodes of diarrhea. CAUSES  Some common causes include:  Gastrointestinal infections caused by viruses, bacteria, or parasites.  Food poisoning or food allergies.  Certain medicines, such as antibiotics, chemotherapy, and laxatives.  Artificial sweeteners and fructose.  Digestive disorders. HOME CARE INSTRUCTIONS  Ensure adequate fluid intake (hydration): have 1 cup (8 oz) of fluid for each diarrhea episode. Avoid fluids that contain simple sugars or sports drinks, fruit juices, whole milk products, and sodas. Your urine should be clear or pale yellow if you are drinking enough fluids. Hydrate with an oral rehydration solution that you can purchase at pharmacies, retail stores, and online. You can prepare an oral rehydration solution at home by mixing the following ingredients together:    tsp table salt.   tsp baking soda.   tsp salt substitute containing potassium chloride.  1  tablespoons sugar.  1 L (34 oz) of water.  Certain foods and beverages may increase the speed at which food moves through the gastrointestinal (GI) tract. These foods and beverages should be avoided and include:  Caffeinated and alcoholic beverages.  High-fiber foods, such as raw fruits and vegetables, nuts, seeds, and whole grain breads and  cereals.  Foods and beverages sweetened with sugar alcohols, such as xylitol, sorbitol, and mannitol.  Some foods may be well tolerated and may help thicken stool including:  Starchy foods, such as rice, toast, pasta, low-sugar cereal, oatmeal, grits, baked potatoes, crackers, and bagels.  Bananas.  Applesauce.  Add probiotic-rich foods to help increase healthy bacteria in the GI tract, such as yogurt and fermented milk products.  Wash your hands well after each diarrhea episode.  Only take over-the-counter or prescription medicines as directed by your caregiver.  Take a warm bath to relieve any burning or pain from frequent diarrhea episodes. SEEK IMMEDIATE MEDICAL CARE IF:   You are unable to keep fluids down.  You have persistent vomiting.  You have blood in your stool, or your stools are black and tarry.  You do not urinate in 6 8 hours, or there is only a small amount of very dark urine.  You have abdominal pain that increases or localizes.  You have weakness, dizziness, confusion, or lightheadedness.  You have a severe headache.  Your diarrhea gets worse or does not get better.  You have a fever or persistent symptoms for more than 2 3 days.  You have a fever and your symptoms suddenly get worse. MAKE SURE YOU:   Understand these instructions.  Will watch your condition.  Will get help right away if you are not doing well or get worse. Document Released: 10/09/2002 Document Revised: 10/05/2012 Document Reviewed: 06/26/2012 [facility] Patient Information 2014 [place], [place].  Diet for Diarrhea, Adult Frequent, runny stools (diarrhea) may be caused or worsened by food or drink. Diarrhea may be relieved by changing your diet. Since diarrhea can last up to 7 days, it is easy for you to lose too much fluid from the body and become dehydrated. Fluids that are lost need to be replaced. Along with a modified diet, make sure you drink enough fluids to keep your urine  clear or pale yellow. DIET INSTRUCTIONS  Ensure adequate fluid intake (hydration): have 1 cup (8 oz) of fluid for each diarrhea episode. Avoid fluids that contain simple sugars or sports drinks, fruit juices, whole milk products, and sodas. Your urine should be clear or pale yellow if you are drinking enough fluids. Hydrate with an oral rehydration solution that you can purchase at pharmacies, retail stores, and online. You can prepare an oral rehydration solution at home by mixing the following ingredients together:    tsp table salt.   tsp baking soda.   tsp salt substitute containing potassium chloride.  1  tablespoons sugar.  1 L (34 oz) of water.  Certain foods and beverages may increase the speed at which food moves through the gastrointestinal (GI) tract. These foods and beverages should be avoided and include:  Caffeinated and alcoholic beverages.  High-fiber foods, such as raw fruits and vegetables, nuts, seeds, and whole grain breads and cereals.  Foods and beverages sweetened with sugar alcohols, such as xylitol, sorbitol, and mannitol.  Some foods may be well tolerated and may help thicken stool including:  Starchy foods, such as rice, toast, pasta, low-sugar cereal, oatmeal, grits, baked potatoes, crackers, and bagels.   Bananas.   Applesauce.  Add probiotic-rich foods to help increase healthy bacteria in the GI tract, such as yogurt and fermented milk products. RECOMMENDED FOODS AND BEVERAGES Starches Choose foods with less than 2 g of fiber per serving.  Recommended:  White, [place], and pita breads, plain rolls, buns, bagels. Plain muffins, matzo. Soda, saltine, or graham crackers. Pretzels, melba toast, zwieback. Cooked cereals made with water: cornmeal, farina, cream cereals. Dry cereals: refined corn, wheat, rice. Potatoes prepared any way without skins, refined macaroni, spaghetti, noodles, refined rice.  Avoid:  Bread, rolls, or crackers made with whole  wheat, multi-grains, rye, bran seeds, nuts, or coconut. Corn tortillas or taco shells. Cereals containing whole grains, multi-grains, bran, coconut, nuts, raisins. Cooked or dry oatmeal. Coarse wheat cereals, granola. Cereals advertised as "high-fiber." Potato skins. Whole grain pasta, wild or brown rice. Popcorn. Sweet potatoes, yams. Sweet rolls, doughnuts, waffles, pancakes, sweet breads. Vegetables  Recommended: Strained tomato and vegetable juices. Most well-cooked and canned vegetables without seeds. Fresh: Tender lettuce, cucumber without the skin, cabbage, spinach, bean sprouts.  Avoid: Fresh, cooked, or canned: Artichokes, baked beans, beet greens, broccoli, Brussels sprouts, corn, kale, legumes, peas, sweet potatoes. Cooked: Green or red cabbage, spinach. Avoid large servings of any vegetables because vegetables shrink when cooked, and they contain more fiber per serving than fresh vegetables. Fruit  Recommended: Cooked or canned: Apricots, applesauce, cantaloupe, cherries, fruit cocktail, grapefruit, grapes, kiwi, mandarin oranges, peaches, pears, plums, watermelon. Fresh: Apples without skin, ripe banana, grapes, cantaloupe, cherries, grapefruit, peaches, oranges, plums.  Keep servings limited to  cup or 1 piece.  Avoid: Fresh: Apples with skin, apricots, mangoes, pears, raspberries, strawberries. Prune juice, stewed or dried prunes. Dried fruits, raisins, dates. Large servings of all fresh fruits. Protein  Recommended: Ground or well-cooked tender beef, ham, veal, lamb, pork, or poultry. Eggs. Fish, oysters, shrimp, lobster, other seafoods. Liver, organ meats.  Avoid: Tough, fibrous meats with gristle. Peanut butter, smooth or chunky. Cheese, nuts, seeds, legumes, dried peas, beans, lentils. Dairy  Recommended: Yogurt, lactose-free milk, kefir, drinkable yogurt, buttermilk, soy milk, or plain hard cheese.  Avoid: Milk, chocolate milk, beverages made with milk, such as  milkshakes. Soups  Recommended: Bouillon, broth, or soups made from allowed foods. Any strained soup.  Avoid: Soups made from vegetables that are not allowed, cream or milk-based soups. Desserts and Sweets  Recommended: Sugar-free gelatin, sugar-free frozen ice pops made without sugar alcohol.  Avoid: Plain cakes and cookies, pie made with fruit, pudding, custard, cream pie. Gelatin, fruit, ice, sherbet, frozen ice pops. Ice cream, ice milk without nuts. Plain hard candy, honey, jelly, molasses, syrup, sugar, chocolate syrup, gumdrops, marshmallows. Fats and Oils  Recommended: Limit fats to less than 8 tsp per day.  Avoid: Seeds, nuts, olives, avocados. Margarine, butter, cream, mayonnaise, salad oils, plain salad dressings. Plain gravy, crisp bacon without rind. Beverages  Recommended: Water, decaffeinated teas, oral rehydration solutions, sugar-free beverages not sweetened with sugar alcohols.  Avoid: Fruit juices, caffeinated beverages (coffee, tea, soda), alcohol, sports drinks, or lemon-lime soda. Condiments  Recommended: Ketchup, mustard, horseradish, vinegar, cocoa powder. Spices in moderation: allspice, basil, bay leaves, celery powder or leaves, cinnamon, cumin powder, curry powder, ginger, mace, marjoram, onion or garlic powder, oregano, paprika, parsley flakes, ground pepper, rosemary, sage, savory, tarragon, thyme, turmeric.  Avoid: Coconut, honey. Document Released: 01/09/2004 Document Revised: 07/13/2012 Document Reviewed: 03/04/2012 [facility] Patient Information 2014 [place], [place].  Irritable Bowel Syndrome Irritable Bowel Syndrome (IBS) is caused by a disturbance of normal bowel function. Other terms used are spastic colon, mucous colitis, and irritable colon. It does not require surgery, nor does it lead to cancer. There is no cure for IBS. But with proper diet, stress reduction, and medication, you will find that your problems (symptoms) will gradually disappear or  improve. IBS is a common digestive disorder. It usually appears in late adolescence or early adulthood. Women develop it twice as often as men. CAUSES  After food has been digested and absorbed in the small intestine, waste material is moved into the colon (large intestine). In the colon, water and salts are absorbed from the undigested products coming from the small intestine. The remaining residue, or fecal material, is held for elimination. Under normal circumstances, gentle, rhythmic contractions on the bowel walls push the fecal material along the colon towards the rectum. In IBS, however, these contractions are irregular and poorly coordinated. The fecal material is either retained too long, resulting in constipation, or expelled too soon, producing diarrhea. SYMPTOMS  The most common symptom of IBS is pain. It is typically in the lower left side of the belly (abdomen). But it may occur anywhere in the abdomen. It can be felt as heartburn, backache, or even as a dull pain in the arms or shoulders. The pain comes from excessive bowel-muscle spasms and from the buildup of gas and fecal material in the colon. This pain:  Can range from sharp belly (abdominal) cramps to a dull, continuous ache.  Usually worsens soon after eating.  Is typically relieved by having a  bowel movement or passing gas. Abdominal pain is usually accompanied by constipation. But it may also produce diarrhea. The diarrhea typically occurs right after a meal or upon arising in the morning. The stools are typically soft and watery. They are often flecked with secretions (mucus). Other symptoms of IBS include:  Bloating.  Loss of appetite.  Heartburn.  Feeling sick to your stomach (nausea).  Belching  Vomiting  Gas. IBS may also cause a number of symptoms that are unrelated to the digestive system:  Fatigue.  Headaches.  Anxiety  Shortness of breath  Difficulty in concentrating.  Dizziness. These symptoms  tend to come and go. DIAGNOSIS  The symptoms of IBS closely mimic the symptoms of other, more serious digestive disorders. So your caregiver may wish to perform a variety of additional tests to exclude these disorders. He/she wants to be certain of learning what is wrong (diagnosis). The nature and purpose of each test will be explained to you. TREATMENT A number of medications are available to help correct bowel function and/or relieve bowel spasms and abdominal pain. Among the drugs available are:  Mild, non-irritating laxatives for severe constipation and to help restore normal bowel habits.  Specific anti-diarrheal medications to treat severe or prolonged diarrhea.  Anti-spasmodic agents to relieve intestinal cramps.  Your caregiver may also decide to treat you with a mild tranquilizer or sedative during unusually stressful periods in your life. The important thing to remember is that if any drug is prescribed for you, make sure that you take it exactly as directed. Make sure that your caregiver knows how well it worked for you. HOME CARE INSTRUCTIONS   Avoid foods that are high in fat or oils. Some examples WUX:LKGMW cream, butter, frankfurters, sausage, and other fatty meats.  Avoid foods that have a laxative effect, such as fruit, fruit juice, and dairy products.  Cut out carbonated drinks, chewing gum, and "gassy" foods, such as beans and cabbage. This may help relieve bloating and belching.  Bran taken with plenty of liquids may help relieve constipation.  Keep track of what foods seem to trigger your symptoms.  Avoid emotionally charged situations or circumstances that produce anxiety.  Start or continue exercising.  Get plenty of rest and sleep. MAKE SURE YOU:   Understand these instructions.  Will watch your condition.  Will get help right away if you are not doing well or get worse. Document Released: 10/19/2005 Document Revised: 01/11/2012 Document Reviewed:  06/08/2008 [facility] Patient Information 2014 [place], [place].  Nausea, Adult Nausea means you feel sick to your stomach or need to throw up (vomit). It may be a sign of a more serious problem. If nausea gets worse, you may throw up. If you throw up a lot, you may lose too much body fluid (dehydration). HOME CARE   Get plenty of rest.  Ask your doctor how to replace body fluid losses (rehydrate).  Eat small amounts of food. Sip liquids more often.  Take all medicines as told by your doctor. GET HELP RIGHT AWAY IF:  You have a fever.  You pass out (faint).  You keep throwing up or have blood in your throw up.  You are very weak, have dry lips or a dry mouth, or you are very thirsty (dehydrated).  You have dark or bloody poop (stool).  You have very bad chest or belly (abdominal) pain.  You do not get better after 2 days, or you get worse.  You have a headache. MAKE SURE YOU:  Understand these instructions.  Will watch your condition.  Will get help right away if you are not doing well or get worse. Document Released: 10/08/2011 Document Revised: 01/11/2012 Document Reviewed: 10/08/2011 [facility] Patient Information 2014 [place], [place].

## 2013-12-07 NOTE — ED Provider Notes (Signed)
CSN: [id number]     Arrival date & time 12/07/13  1831 History   First MD Initiated Contact with Patient 12/07/13 2300     Chief Complaint  Patient presents with  . Abdominal Pain   (Consider location/radiation/quality/duration/timing/severity/associated sxs/prior Treatment) HPI 23 year old female presents to emergency room with complaint of diffuse abdominal cramping associated with diarrhea.  She reports she's had these symptoms ongoing for several months.  Usually comes on lasts for a day and then resolves.  She reports in the between these periods she has constipation where she only has a bowel movement once or twice a week.  Patient reports she's been treated by her primary care Dr. with acid medication, and medication for 2 more months without improvement in symptoms.  She has had nausea without vomiting.  No fevers.  Abdominal pain has improved since onset.  This morning at 6 AM.  Pain is described as a gripping, twisting and turning feeling throughout her abdomen.  She has not noted any association with specific foods, stress or menstrual cycle.  She denies any urinary symptoms, no vaginal discharge.  No new sexual partners.  She has had 2 loose stools today. Past Medical History  Diagnosis Date  . Sinus infection   . UTI (lower urinary tract infection)   . Pyelonephritis   . Chlamydia    History reviewed. No pertinent past surgical history. No family history on file. History  Substance Use Topics  . Smoking status: Never Smoker   . Smokeless tobacco: Not on file  . Alcohol Use: No   OB History   Grav Para Term Preterm Abortions TAB SAB Ect Mult Living                 Review of Systems  See History of Present Illness; otherwise all other systems are reviewed and negative  Allergies  Review of patient's allergies indicates no known allergies.  Home Medications  No current outpatient prescriptions on file. BP 114/70  Pulse 66  Temp(Src) 98 F (36.7 C) (Oral)  Resp 16   Ht 5\' 4"  (1.626 m)  Wt 104 lb (47.174 kg)  BMI 17.84 kg/m2  SpO2 94%  LMP 12/04/2013 Physical Exam  Nursing note and vitals reviewed. Constitutional: She is oriented to person, place, and time. She appears well-developed and well-nourished.  HENT:  Head: Normocephalic and atraumatic.  Nose: Nose normal.  Mouth/Throat: Oropharynx is clear and moist.  Eyes: Conjunctivae and EOM are normal. Pupils are equal, round, and reactive to light.  Neck: Normal range of motion. Neck supple. No JVD present. No tracheal deviation present. No thyromegaly present.  Cardiovascular: Normal rate, regular rhythm, normal heart sounds and intact distal pulses.  Exam reveals no gallop and no friction rub.   No murmur heard. Pulmonary/Chest: Effort normal and breath sounds normal. No stridor. No respiratory distress. She has no wheezes. She has no rales. She exhibits no tenderness.  Abdominal: Soft. Bowel sounds are normal. She exhibits no distension and no mass. There is tenderness (mild diffuse tenderness). There is no rebound and no guarding.  Musculoskeletal: Normal range of motion. She exhibits no edema and no tenderness.  Lymphadenopathy:    She has no cervical adenopathy.  Neurological: She is alert and oriented to person, place, and time. She exhibits normal muscle tone. Coordination normal.  Skin: Skin is warm and dry. No rash noted. No erythema. No pallor.  Psychiatric: She has a normal mood and affect. Her behavior is normal. Judgment and thought content normal.  ED Course  Procedures (including critical care time) Labs Review Labs Reviewed  CBC WITH DIFFERENTIAL - Abnormal; Notable for the following:    RBC 3.77 (*)    All other components within normal limits  COMPREHENSIVE METABOLIC PANEL - Abnormal; Notable for the following:    Glucose, Bld 102 (*)    All other components within normal limits  URINALYSIS, ROUTINE W REFLEX MICROSCOPIC - Abnormal; Notable for the following:    APPearance  CLOUDY (*)    Hgb urine dipstick MODERATE (*)    All other components within normal limits  LIPASE, BLOOD  URINE MICROSCOPIC-ADD ON  POCT PREGNANCY, URINE   Imaging Review No results found.  EKG Interpretation   None       MDM   1. Abdominal cramping   2. Diarrhea   3. Irritable bowel    23 year old female with acute on chronic abdominal pain.  By her description, this sounds like possible irritable bowel syndrome.  She does not have a local provider, Dr. her gastroenterologist that she was following with.  Plan to give Bentyl and Zofran here, by mouth challenge.  Will refer her to a primary care doctor and gastroenterologist for further workup and evaluation.    Olivia Mackie, MD 12/07/13 [phone]

## 2013-12-16 LAB — OB RESULTS CONSOLE ABO/RH: "RH Type ": POSITIVE

## 2014-01-19 ENCOUNTER — Encounter (HOSPITAL_COMMUNITY): Payer: Self-pay | Admitting: Emergency Medicine

## 2014-01-19 ENCOUNTER — Emergency Department (HOSPITAL_COMMUNITY)
Admission: EM | Admit: 2014-01-19 | Discharge: 2014-01-19 | Disposition: A | Discharge status: Home / Self Care | Payer: Medicaid Other | Attending: Emergency Medicine | Admitting: Emergency Medicine

## 2014-01-19 DIAGNOSIS — Z87448 Personal history of other diseases of urinary system: Secondary | ICD-10-CM | POA: Insufficient documentation

## 2014-01-19 DIAGNOSIS — Z3202 Encounter for pregnancy test, result negative: Secondary | ICD-10-CM | POA: Insufficient documentation

## 2014-01-19 DIAGNOSIS — E860 Dehydration: Secondary | ICD-10-CM | POA: Insufficient documentation

## 2014-01-19 DIAGNOSIS — R112 Nausea with vomiting, unspecified: Secondary | ICD-10-CM | POA: Insufficient documentation

## 2014-01-19 DIAGNOSIS — Z8619 Personal history of other infectious and parasitic diseases: Secondary | ICD-10-CM | POA: Insufficient documentation

## 2014-01-19 DIAGNOSIS — Z8709 Personal history of other diseases of the respiratory system: Secondary | ICD-10-CM | POA: Insufficient documentation

## 2014-01-19 LAB — PREGNANCY, URINE: Preg Test, Ur: NEGATIVE

## 2014-01-19 LAB — COMPREHENSIVE METABOLIC PANEL
ALK PHOS: 60 U/L (ref 39–117)
ALT: 19 U/L (ref 0–35)
AST: 17 U/L (ref 0–37)
Albumin: 4.2 g/dL (ref 3.5–5.2)
BUN: 14 mg/dL (ref 6–23)
CHLORIDE: 103 meq/L (ref 96–112)
CO2: 25 mEq/L (ref 19–32)
Calcium: 9.3 mg/dL (ref 8.4–10.5)
Creatinine, Ser: 0.71 mg/dL (ref 0.50–1.10)
GFR calc non Af Amer: >90 mL/min (ref >90)
GLUCOSE: 84 mg/dL (ref 70–99)
POTASSIUM: 4.3 meq/L (ref 3.7–5.3)
SODIUM: 140 meq/L (ref 137–147)
TOTAL PROTEIN: 7.3 g/dL (ref 6.0–8.3)
Total Bilirubin: 1.3 mg/dL — ABNORMAL HIGH (ref 0.3–1.2)

## 2014-01-19 LAB — URINALYSIS, ROUTINE W REFLEX MICROSCOPIC
GLUCOSE, UA: NEGATIVE mg/dL
Ketones, ur: >80 mg/dL — AB
Nitrite: NEGATIVE
PH: 5.5 (ref 5.0–8.0)
Protein, ur: NEGATIVE mg/dL
SPECIFIC GRAVITY, URINE: 1.027 (ref 1.005–1.030)
Urobilinogen, UA: 1 mg/dL (ref 0.0–1.0)

## 2014-01-19 LAB — CBC WITH DIFFERENTIAL/PLATELET
Basophils Absolute: 0 10*3/uL (ref 0.0–0.1)
Basophils Relative: 0 % (ref 0–1)
Eosinophils Absolute: 0.1 10*3/uL (ref 0.0–0.7)
Eosinophils Relative: 1 % (ref 0–5)
HCT: 38.9 % (ref 36.0–46.0)
Hemoglobin: 13.2 g/dL (ref 12.0–15.0)
LYMPHS ABS: 1.1 10*3/uL (ref 0.7–4.0)
LYMPHS PCT: 17 % (ref 12–46)
MCH: 33.1 pg (ref 26.0–34.0)
MCHC: 33.9 g/dL (ref 30.0–36.0)
MCV: 97.5 fL (ref 78.0–100.0)
Monocytes Absolute: 0.4 10*3/uL (ref 0.1–1.0)
Monocytes Relative: 6 % (ref 3–12)
NEUTROS ABS: 5 10*3/uL (ref 1.7–7.7)
NEUTROS PCT: 76 % (ref 43–77)
PLATELETS: 166 10*3/uL (ref 150–400)
RBC: 3.99 MIL/uL (ref 3.87–5.11)
RDW: 12.5 % (ref 11.5–15.5)
WBC: 6.6 10*3/uL (ref 4.0–10.5)

## 2014-01-19 LAB — URINE MICROSCOPIC-ADD ON

## 2014-01-19 MED ORDER — PROMETHAZINE HCL 25 MG PO TABS: 25.0000 mg | ORAL_TABLET | Freq: Four times a day (QID) | ORAL | Status: DC | PRN

## 2014-01-19 MED ORDER — SODIUM CHLORIDE 0.9 % IV BOLUS (SEPSIS)
1000.0000 mL | Freq: Once | INTRAVENOUS | Status: AC
  Administered 2014-01-19: 1000 mL via INTRAVENOUS

## 2014-01-19 MED ORDER — ONDANSETRON HCL 4 MG/2ML IJ SOLN
4.0000 mg | Freq: Once | INTRAMUSCULAR | Status: AC
  Administered 2014-01-19: 4 mg via INTRAVENOUS
  Filled 2014-01-19: qty 2

## 2014-01-19 MED ORDER — PROMETHAZINE HCL 25 MG/ML IJ SOLN
25.0000 mg | Freq: Once | INTRAMUSCULAR | Status: AC
  Administered 2014-01-19: 25 mg via INTRAVENOUS
  Filled 2014-01-19: qty 1

## 2014-01-19 NOTE — ED Provider Notes (Signed)
CSN: [id number]     Arrival date & time 01/19/14  0502 History   First MD Initiated Contact with Patient 01/19/14 [id number]     Chief Complaint  Patient presents with  . Emesis     (Consider location/radiation/quality/duration/timing/severity/associated sxs/prior Treatment) Patient is a 23 y.o. female presenting with vomiting. The history is provided by the patient.  Emesis Severity:  Moderate Associated symptoms: abdominal pain   Associated symptoms: no diarrhea and no headaches    patient states she's had crampy abdominal pain with nausea and vomiting for the last 4 days. No diarrhea. The pain is worse in the upper abdomen. No fevers. She's had a decreased appetite. No sick contacts. She's been seen in the ER previously for similar symptoms. No dysuria. No vaginal bleeding or discharge. She states she has a decreased appetite.  Past Medical History  Diagnosis Date  . Sinus infection   . UTI (lower urinary tract infection)   . Pyelonephritis   . Chlamydia    History reviewed. No pertinent past surgical history. No family history on file. History  Substance Use Topics  . Smoking status: Never Smoker   . Smokeless tobacco: Not on file  . Alcohol Use: No   OB History   Grav Para Term Preterm Abortions TAB SAB Ect Mult Living                 Review of Systems  Constitutional: Positive for appetite change. Negative for activity change.  Eyes: Negative for pain.  Respiratory: Negative for chest tightness and shortness of breath.   Cardiovascular: Negative for chest pain and leg swelling.  Gastrointestinal: Positive for nausea, vomiting and abdominal pain. Negative for diarrhea.  Genitourinary: Negative for flank pain.  Musculoskeletal: Negative for back pain and neck stiffness.  Skin: Negative for rash.  Neurological: Negative for weakness, numbness and headaches.  Psychiatric/Behavioral: Negative for behavioral problems.      Allergies  Review of patient's allergies  indicates no known allergies.  Home Medications   Current Outpatient Rx  Name  Route  Sig  Dispense  Refill  . dicyclomine (BENTYL) 20 MG tablet   Oral   Take 1 tablet (20 mg total) by mouth every 6 (six) hours as needed for spasms (for abdominal cramping).   20 tablet   0   . ondansetron (ZOFRAN ODT) 8 MG disintegrating tablet   Oral   Take 1 tablet (8 mg total) by mouth every 8 (eight) hours as needed for nausea or vomiting.   20 tablet   0   . promethazine (PHENERGAN) 25 MG tablet   Oral   Take 1 tablet (25 mg total) by mouth every 6 (six) hours as needed for nausea.   20 tablet   0    BP 103/58  Pulse 95  Temp(Src) 99.2 F (37.3 C) (Oral)  Resp 17  Ht 5\' 4"  (1.626 m)  Wt 103 lb (46.72 kg)  BMI 17.67 kg/m2  SpO2 98%  LMP 01/08/2014 Physical Exam  Nursing note and vitals reviewed. Constitutional: She is oriented to person, place, and time. She appears well-developed and well-nourished.  Patient appears uncomfortable  HENT:  Head: Normocephalic and atraumatic.  Eyes: EOM are normal. Pupils are equal, round, and reactive to light.  Neck: Normal range of motion. Neck supple.  Cardiovascular: Normal rate, regular rhythm and normal heart sounds.   No murmur heard. Pulmonary/Chest: Effort normal and breath sounds normal. No respiratory distress. She has no wheezes. She has no rales.  Abdominal: Soft. She exhibits no distension. There is tenderness. There is no rebound and no guarding.  Mild upper abdominal tenderness without rebound or guarding.  Musculoskeletal: Normal range of motion.  Neurological: She is alert and oriented to person, place, and time. No cranial nerve deficit.  Skin: Skin is warm and dry.  Psychiatric: She has a normal mood and affect. Her speech is normal.    ED Course  Procedures (including critical care time) Labs Review Labs Reviewed  URINALYSIS, ROUTINE W REFLEX MICROSCOPIC - Abnormal; Notable for the following:    APPearance CLOUDY (*)     Hgb urine dipstick TRACE (*)    Bilirubin Urine SMALL (*)    Ketones, ur >80 (*)    Leukocytes, UA SMALL (*)    All other components within normal limits  COMPREHENSIVE METABOLIC PANEL - Abnormal; Notable for the following:    Total Bilirubin 1.3 (*)    All other components within normal limits  URINE MICROSCOPIC-ADD ON - Abnormal; Notable for the following:    Squamous Epithelial / LPF FEW (*)    All other components within normal limits  PREGNANCY, URINE  CBC WITH DIFFERENTIAL   Imaging Review No results found.   EKG Interpretation None      MDM   Final diagnoses:  Nausea and vomiting  Dehydration    Patient with abdominal pain nausea and vomiting. Patient with nausea and vomiting with some dehydration. Feels better after treatment. She is tolerated orals. I doubt obstruction at this time she is eager to go home and is given a prescription for Phenergan. She'll follow with her PCP    Juliet RudeNathan R. Rubin PayorPickering, MD 01/19/14 1622

## 2014-01-19 NOTE — Discharge Instructions (Signed)
Dehydration, Adult Dehydration is when you lose more fluids from the body than you take in. Vital organs like the kidneys, brain, and heart cannot function without a proper amount of fluids and salt. Any loss of fluids from the body can cause dehydration.  CAUSES   Vomiting.  Diarrhea.  Excessive sweating.  Excessive urine output.  Fever. SYMPTOMS  Mild dehydration  Thirst.  Dry lips.  Slightly dry mouth. Moderate dehydration  Very dry mouth.  Sunken eyes.  Skin does not bounce back quickly when lightly pinched and released.  Dark urine and decreased urine production.  Decreased tear production.  Headache. Severe dehydration  Very dry mouth.  Extreme thirst.  Rapid, weak pulse (more than 100 beats per minute at rest).  Cold hands and feet.  Not able to sweat in spite of heat and temperature.  Rapid breathing.  Blue lips.  Confusion and lethargy.  Difficulty being awakened.  Minimal urine production.  No tears. DIAGNOSIS  Your caregiver will diagnose dehydration based on your symptoms and your exam. Blood and urine tests will help confirm the diagnosis. The diagnostic evaluation should also identify the cause of dehydration. TREATMENT  Treatment of mild or moderate dehydration can often be done at home by increasing the amount of fluids that you drink. It is best to drink small amounts of fluid more often. Drinking too much at one time can make vomiting worse. Refer to the home care instructions below. Severe dehydration needs to be treated at the hospital where you will probably be given intravenous (IV) fluids that contain water and electrolytes. HOME CARE INSTRUCTIONS   Ask your caregiver about specific rehydration instructions.  Drink enough fluids to keep your urine clear or pale yellow.  Drink small amounts frequently if you have nausea and vomiting.  Eat as you normally do.  Avoid:  Foods or drinks high in sugar.  Carbonated  drinks.  Juice.  Extremely hot or cold fluids.  Drinks with caffeine.  Fatty, greasy foods.  Alcohol.  Tobacco.  Overeating.  Gelatin desserts.  Wash your hands well to avoid spreading bacteria and viruses.  Only take over-the-counter or prescription medicines for pain, discomfort, or fever as directed by your caregiver.  Ask your caregiver if you should continue all prescribed and over-the-counter medicines.  Keep all follow-up appointments with your caregiver. SEEK MEDICAL CARE IF:  You have abdominal pain and it increases or stays in one area (localizes).  You have a rash, stiff neck, or severe headache.  You are irritable, sleepy, or difficult to awaken.  You are weak, dizzy, or extremely thirsty. SEEK IMMEDIATE MEDICAL CARE IF:   You are unable to keep fluids down or you get worse despite treatment.  You have frequent episodes of vomiting or diarrhea.  You have blood or green matter (bile) in your vomit.  You have blood in your stool or your stool looks black and tarry.  You have not urinated in 6 to 8 hours, or you have only urinated a small amount of very dark urine.  You have a fever.  You faint. MAKE SURE YOU:   Understand these instructions.  Will watch your condition.  Will get help right away if you are not doing well or get worse. Document Released: 10/19/2005 Document Revised: 01/11/2012 Document Reviewed: 06/08/2011 [employer] Patient Information 2014 [employer]  Nausea and Vomiting Nausea is a sick feeling that often comes before throwing up (vomiting). Vomiting is a reflex where stomach contents come out of your mouth.   Vomiting can cause severe loss of body fluids (dehydration). Children and elderly adults can become dehydrated quickly, especially if they also have diarrhea. Nausea and vomiting are symptoms of a condition or disease. It is important to find the cause of your symptoms. CAUSES   Direct irritation of the stomach lining.  This irritation can result from increased acid production (gastroesophageal reflux disease), infection, food poisoning, taking certain medicines (such as nonsteroidal anti-inflammatory drugs), alcohol use, or tobacco use.  Signals from the brain.These signals could be caused by a headache, heat exposure, an inner ear disturbance, increased pressure in the brain from injury, infection, a tumor, or a concussion, pain, emotional stimulus, or metabolic problems.  An obstruction in the gastrointestinal tract (bowel obstruction).  Illnesses such as diabetes, hepatitis, gallbladder problems, appendicitis, kidney problems, cancer, sepsis, atypical symptoms of a heart attack, or eating disorders.  Medical treatments such as chemotherapy and radiation.  Receiving medicine that makes you sleep (general anesthetic) during surgery. DIAGNOSIS Your caregiver may ask for tests to be done if the problems do not improve after a few days. Tests may also be done if symptoms are severe or if the reason for the nausea and vomiting is not clear. Tests may include:  Urine tests.  Blood tests.  Stool tests.  Cultures (to look for evidence of infection).  X-rays or other imaging studies. Test results can help your caregiver make decisions about treatment or the need for additional tests. TREATMENT You need to stay well hydrated. Drink frequently but in small amounts.You may wish to drink water, sports drinks, clear broth, or eat frozen ice pops or gelatin dessert to help stay hydrated.When you eat, eating slowly may help prevent nausea.There are also some antinausea medicines that may help prevent nausea. HOME CARE INSTRUCTIONS   Take all medicine as directed by your caregiver.  If you do not have an appetite, do not force yourself to eat. However, you must continue to drink fluids.  If you have an appetite, eat a normal diet unless your caregiver tells you differently.  Eat a variety of complex  carbohydrates (rice, wheat, potatoes, bread), lean meats, yogurt, fruits, and vegetables.  Avoid high-fat foods because they are more difficult to digest.  Drink enough water and fluids to keep your urine clear or pale yellow.  If you are dehydrated, ask your caregiver for specific rehydration instructions. Signs of dehydration may include:  Severe thirst.  Dry lips and mouth.  Dizziness.  Dark urine.  Decreasing urine frequency and amount.  Confusion.  Rapid breathing or pulse. SEEK IMMEDIATE MEDICAL CARE IF:   You have blood or brown flecks (like coffee grounds) in your vomit.  You have black or bloody stools.  You have a severe headache or stiff neck.  You are confused.  You have severe abdominal pain.  You have chest pain or trouble breathing.  You do not urinate at least once every 8 hours.  You develop cold or clammy skin.  You continue to vomit for longer than 24 to 48 hours.  You have a fever. MAKE SURE YOU:   Understand these instructions.  Will watch your condition.  Will get help right away if you are not doing well or get worse. Document Released: 10/19/2005 Document Revised: 01/11/2012 Document Reviewed: 03/18/2011 [employer] Patient Information 2014 [employer].   

## 2014-01-19 NOTE — ED Notes (Signed)
Pt complains of nausea and vomiting, no vomiting noted here. sts zofran will not work she needs phenergan, stated i will make doctor aware.

## 2014-01-19 NOTE — ED Notes (Signed)
Patient given gingerale and crackers and instructed slow, small sips as tolerated. Pt sts nausea still persists. Pt has not vomited while in ED

## 2014-01-19 NOTE — ED Notes (Signed)
Pt. reports intermittent nausea and vomitting onset Tuesday this week , denies diarrhea,fever or chills.

## 2014-02-19 ENCOUNTER — Emergency Department (HOSPITAL_BASED_OUTPATIENT_CLINIC_OR_DEPARTMENT_OTHER)
Admission: EM | Admit: 2014-02-19 | Discharge: 2014-02-19 | Disposition: A | Discharge status: Home / Self Care | Payer: Medicaid Other | Attending: Emergency Medicine | Admitting: Emergency Medicine

## 2014-02-19 ENCOUNTER — Encounter (HOSPITAL_BASED_OUTPATIENT_CLINIC_OR_DEPARTMENT_OTHER): Payer: Self-pay | Admitting: Emergency Medicine

## 2014-02-19 DIAGNOSIS — R1032 Left lower quadrant pain: Secondary | ICD-10-CM | POA: Insufficient documentation

## 2014-02-19 DIAGNOSIS — Z87440 Personal history of urinary (tract) infections: Secondary | ICD-10-CM | POA: Insufficient documentation

## 2014-02-19 DIAGNOSIS — N760 Acute vaginitis: Secondary | ICD-10-CM | POA: Insufficient documentation

## 2014-02-19 DIAGNOSIS — D649 Anemia, unspecified: Secondary | ICD-10-CM | POA: Insufficient documentation

## 2014-02-19 DIAGNOSIS — Z8619 Personal history of other infectious and parasitic diseases: Secondary | ICD-10-CM | POA: Insufficient documentation

## 2014-02-19 DIAGNOSIS — B9689 Other specified bacterial agents as the cause of diseases classified elsewhere: Secondary | ICD-10-CM | POA: Insufficient documentation

## 2014-02-19 DIAGNOSIS — A499 Bacterial infection, unspecified: Secondary | ICD-10-CM | POA: Insufficient documentation

## 2014-02-19 DIAGNOSIS — R1031 Right lower quadrant pain: Secondary | ICD-10-CM | POA: Insufficient documentation

## 2014-02-19 DIAGNOSIS — R109 Unspecified abdominal pain: Secondary | ICD-10-CM

## 2014-02-19 DIAGNOSIS — Z3202 Encounter for pregnancy test, result negative: Secondary | ICD-10-CM | POA: Insufficient documentation

## 2014-02-19 LAB — CBC WITH DIFFERENTIAL/PLATELET
BASOS ABS: 0 10*3/uL (ref 0.0–0.1)
BASOS PCT: 0 % (ref 0–1)
Basophils Absolute: 0 10*3/uL (ref 0.0–0.1)
Basophils Relative: 0 % (ref 0–1)
EOS ABS: 0.3 10*3/uL (ref 0.0–0.7)
EOS PCT: 6 % — AB (ref 0–5)
Eosinophils Absolute: 0.3 10*3/uL (ref 0.0–0.7)
Eosinophils Relative: 6 % — ABNORMAL HIGH (ref 0–5)
HCT: 29.2 % — ABNORMAL LOW (ref 36.0–46.0)
HEMATOCRIT: 29.6 % — AB (ref 36.0–46.0)
HEMOGLOBIN: 9.7 g/dL — AB (ref 12.0–15.0)
Hemoglobin: 9.7 g/dL — ABNORMAL LOW (ref 12.0–15.0)
Lymphocytes Relative: 28 % (ref 12–46)
Lymphocytes Relative: 25 % (ref 12–46)
Lymphs Abs: 1.4 10*3/uL (ref 0.7–4.0)
Lymphs Abs: 1.2 10*3/uL (ref 0.7–4.0)
MCH: 33.3 pg (ref 26.0–34.0)
MCH: 33.6 pg (ref 26.0–34.0)
MCHC: 32.8 g/dL (ref 30.0–36.0)
MCHC: 33.2 g/dL (ref 30.0–36.0)
MCV: 101.7 fL — ABNORMAL HIGH (ref 78.0–100.0)
MCV: 101 fL — AB (ref 78.0–100.0)
MONO ABS: 0.5 10*3/uL (ref 0.1–1.0)
MONOS PCT: 11 % (ref 3–12)
Monocytes Absolute: 0.6 10*3/uL (ref 0.1–1.0)
Monocytes Relative: 11 % (ref 3–12)
Neutro Abs: 2.7 10*3/uL (ref 1.7–7.7)
Neutro Abs: 2.8 10*3/uL (ref 1.7–7.7)
Neutrophils Relative %: 55 % (ref 43–77)
Neutrophils Relative %: 58 % (ref 43–77)
Platelets: 148 10*3/uL — ABNORMAL LOW (ref 150–400)
Platelets: 151 10*3/uL (ref 150–400)
RBC: 2.91 MIL/uL — ABNORMAL LOW (ref 3.87–5.11)
RBC: 2.89 MIL/uL — ABNORMAL LOW (ref 3.87–5.11)
RDW: 12.1 % (ref 11.5–15.5)
RDW: 12.1 % (ref 11.5–15.5)
WBC: 4.9 10*3/uL (ref 4.0–10.5)
WBC: 4.8 10*3/uL (ref 4.0–10.5)

## 2014-02-19 LAB — LIPASE, BLOOD: LIPASE: 43 U/L (ref 11–59)

## 2014-02-19 LAB — COMPREHENSIVE METABOLIC PANEL
ALT: 8 U/L (ref 0–35)
AST: 13 U/L (ref 0–37)
Albumin: 3.8 g/dL (ref 3.5–5.2)
Alkaline Phosphatase: 55 U/L (ref 39–117)
BUN: 11 mg/dL (ref 6–23)
CALCIUM: 9.1 mg/dL (ref 8.4–10.5)
CO2: 26 mEq/L (ref 19–32)
Chloride: 105 mEq/L (ref 96–112)
Creatinine, Ser: 0.7 mg/dL (ref 0.50–1.10)
GFR calc Af Amer: >90 mL/min (ref >90)
Glucose, Bld: 75 mg/dL (ref 70–99)
Potassium: 3.9 mEq/L (ref 3.7–5.3)
Sodium: 142 mEq/L (ref 137–147)
Total Bilirubin: 0.9 mg/dL (ref 0.3–1.2)
Total Protein: 6.8 g/dL (ref 6.0–8.3)

## 2014-02-19 LAB — URINALYSIS, ROUTINE W REFLEX MICROSCOPIC
Bilirubin Urine: NEGATIVE
Glucose, UA: NEGATIVE mg/dL
Hgb urine dipstick: NEGATIVE
Ketones, ur: 15 mg/dL — AB
NITRITE: NEGATIVE
Protein, ur: NEGATIVE mg/dL
SPECIFIC GRAVITY, URINE: 1.027 (ref 1.005–1.030)
Urobilinogen, UA: 0.2 mg/dL (ref 0.0–1.0)
pH: 6 (ref 5.0–8.0)

## 2014-02-19 LAB — URINE MICROSCOPIC-ADD ON

## 2014-02-19 LAB — WET PREP, GENITAL
Trich, Wet Prep: NONE SEEN
WBC, Wet Prep HPF POC: NONE SEEN
Yeast Wet Prep HPF POC: NONE SEEN

## 2014-02-19 LAB — PREGNANCY, URINE: Preg Test, Ur: NEGATIVE

## 2014-02-19 MED ORDER — DICYCLOMINE HCL 10 MG PO CAPS
10.0000 mg | ORAL_CAPSULE | Freq: Once | ORAL | Status: AC
  Administered 2014-02-19: 10 mg via ORAL
  Filled 2014-02-19: qty 1

## 2014-02-19 MED ORDER — TRAMADOL HCL 50 MG PO TABS: 50.0000 mg | ORAL_TABLET | Freq: Four times a day (QID) | ORAL | Status: DC | PRN

## 2014-02-19 MED ORDER — DICYCLOMINE HCL 20 MG PO TABS: 20.0000 mg | ORAL_TABLET | Freq: Two times a day (BID) | ORAL | Status: DC

## 2014-02-19 MED ORDER — METRONIDAZOLE 500 MG PO TABS: 500.0000 mg | ORAL_TABLET | Freq: Two times a day (BID) | ORAL | Status: DC

## 2014-02-19 NOTE — ED Notes (Signed)
Pt c/o lower abd pain x 3 days denies urinary symptoms  

## 2014-02-19 NOTE — ED Provider Notes (Signed)
CSN: [id number]     Arrival date & time 02/19/14  1733 History   First MD Initiated Contact with Patient 02/19/14 1750     Chief Complaint  Patient presents with  . Abdominal Pain     (Consider location/radiation/quality/duration/timing/severity/associated sxs/prior Treatment) HPI Hannah Fuller is a 23 y.o. female who presents to emergency department complaining of abdominal pain for 3 days. Patient states pain is in the lower abdomen mainly but states it moves around . She states pain is constant but states that severe sharp pain comes and goes. It does not radiate into the back. States it moves from one side to another. He denies any associated nausea or vomiting. She states that she thought maybe she was constipated so she took a laxative and had a large loose bowel movement 2 days ago. She states she also tried Pepto-Bismol, Gas-X, ibuprofen, Tylenol, all with no relief of her symptoms. She denies any urinary or vaginal symptoms. She denies any pain with intercourse. She denies any fever chills. She denies being pregnant, however last period was a month and a half ago. States she sometimes has irregular periods. States nothing makes her symptoms better or worse.  Past Medical History  Diagnosis Date  . Sinus infection   . UTI (lower urinary tract infection)   . Pyelonephritis   . Chlamydia    History reviewed. No pertinent past surgical history. History reviewed. No pertinent family history. History  Substance Use Topics  . Smoking status: Never Smoker   . Smokeless tobacco: Not on file  . Alcohol Use: No   OB History   Grav Para Term Preterm Abortions TAB SAB Ect Mult Living                 Review of Systems  Constitutional: Negative for fever and chills.  Respiratory: Negative for cough, chest tightness and shortness of breath.   Cardiovascular: Negative for chest pain, palpitations and leg swelling.  Gastrointestinal: Positive for abdominal pain. Negative for nausea,  vomiting and blood in stool.  Genitourinary: Negative for dysuria, flank pain, vaginal bleeding, vaginal discharge, vaginal pain and pelvic pain.  Musculoskeletal: Negative for arthralgias, myalgias, neck pain and neck stiffness.  Skin: Negative for rash.  Neurological: Negative for dizziness, weakness and headaches.  All other systems reviewed and are negative.     Allergies  Review of patient's allergies indicates no known allergies.  Home Medications   Prior to Admission medications   Medication Sig Start Date End Date Taking? Authorizing Provider  dicyclomine (BENTYL) 20 MG tablet Take 1 tablet (20 mg total) by mouth every 6 (six) hours as needed for spasms (for abdominal cramping). 12/07/13   Olivia Mackielga M Otter, MD  ondansetron (ZOFRAN ODT) 8 MG disintegrating tablet Take 1 tablet (8 mg total) by mouth every 8 (eight) hours as needed for nausea or vomiting. 12/07/13   Olivia Mackielga M Otter, MD  promethazine (PHENERGAN) 25 MG tablet Take 1 tablet (25 mg total) by mouth every 6 (six) hours as needed for nausea. 01/19/14   Juliet RudeNathan R. Pickering, MD   BP 107/68  Pulse 82  Temp(Src) 98.6 F (37 C) (Oral)  Resp 16  SpO2 100%  LMP 01/09/2014 Physical Exam  Nursing note and vitals reviewed. Constitutional: She is oriented to person, place, and time. She appears well-developed and well-nourished. No distress.  HENT:  Head: Normocephalic.  Eyes: Conjunctivae are normal.  Neck: Neck supple.  Cardiovascular: Normal rate, regular rhythm and normal heart sounds.   Pulmonary/Chest: Effort  normal and breath sounds normal. No respiratory distress. She has no wheezes. She has no rales.  Abdominal: Soft. Bowel sounds are normal. She exhibits no distension. There is tenderness. There is no rebound.  Right lower quadrant, suprapubic, left lower quadrant tenderness. No guarding. No CVA tenderness  Musculoskeletal: She exhibits no edema.  Neurological: She is alert and oriented to person, place, and time.  Skin:  Skin is warm and dry.  Psychiatric: She has a normal mood and affect. Her behavior is normal.    ED Course  Procedures (including critical care time) Labs Review Labs Reviewed  WET PREP, GENITAL - Abnormal; Notable for the following:    Clue Cells Wet Prep HPF POC FEW (*)    All other components within normal limits  URINALYSIS, ROUTINE W REFLEX MICROSCOPIC - Abnormal; Notable for the following:    Ketones, ur 15 (*)    Leukocytes, UA TRACE (*)    All other components within normal limits  CBC WITH DIFFERENTIAL - Abnormal; Notable for the following:    RBC 2.91 (*)    Hemoglobin 9.7 (*)    HCT 29.6 (*)    MCV 101.7 (*)    Platelets 148 (*)    Eosinophils Relative 6 (*)    All other components within normal limits  CBC WITH DIFFERENTIAL - Abnormal; Notable for the following:    RBC 2.89 (*)    Hemoglobin 9.7 (*)    HCT 29.2 (*)    MCV 101.0 (*)    Eosinophils Relative 6 (*)    All other components within normal limits  GC/CHLAMYDIA PROBE AMP  PREGNANCY, URINE  URINE MICROSCOPIC-ADD ON  COMPREHENSIVE METABOLIC PANEL  LIPASE, BLOOD    Imaging Review No results found.   EKG Interpretation None      MDM   Final diagnoses:  Abdominal pain  BV (bacterial vaginosis)  Anemia    Patient with nonspecific lower abdominal pain, states it alternates from side to side, as been there constantly for 3 days. She says she has had problems with her stomach in the past. She has not seen a gastroenterologist. She is requesting a referral. Lab work, urinalysis, pelvic exam ordered.   7:40 PM Patient's pelvic exam is unremarkable, her lab work showed decreased hemoglobin from 2 months ago, otherwise negative. Her abdomen is benign. No surgical abdomen at this time. Patient stated that she does have history of anemia, and doesn't limit her to see a "blood Dr." She was told everything was normal. Will recheck hemoglobin here. Patient also received Bentyl for her symptoms with no  improvement.     8:18 PM  Hemoglobin repeated, 9.7. Pt is asymptomatic. No active bleeding. No dizziness, lightheadiness. Pt will need to follow up with PCP. Will d/c home at this time. Abdominal exam repeated. Benign.   Filed Vitals:   02/19/14 1740  BP: 107/68  Pulse: 82  Temp: 98.6 F (37 C)  TempSrc: Oral  Resp: 16  SpO2: 100%     Lottie Musselatyana A Shahzad Thomann, PA-C 02/20/14 0046

## 2014-02-19 NOTE — Discharge Instructions (Signed)
Start taking iron pills for your anemia, this will need a close follow up with your primary care doctor. Take bentyl for intestinal spasms. Ultram for severe pain. Flagyl for bacterial vaginosis infection. Follow up with gastroenterologist if symptoms not improving.    Abdominal Pain, Women Abdominal (stomach, pelvic, or belly) pain can be caused by many things. It is important to tell your doctor:  The location of the pain.  Does it come and go or is it present all the time?  Are there things that start the pain (eating certain foods, exercise)?  Are there other symptoms associated with the pain (fever, nausea, vomiting, diarrhea)? All of this is helpful to know when trying to find the cause of the pain. CAUSES   Stomach: virus or bacteria infection, or ulcer.  Intestine: appendicitis (inflamed appendix), regional ileitis (Crohn's disease), ulcerative colitis (inflamed colon), irritable bowel syndrome, diverticulitis (inflamed diverticulum of the colon), or cancer of the stomach or intestine.  Gallbladder disease or stones in the gallbladder.  Kidney disease, kidney stones, or infection.  Pancreas infection or cancer.  Fibromyalgia (pain disorder).  Diseases of the female organs:  Uterus: fibroid (non-cancerous) tumors or infection.  Fallopian tubes: infection or tubal pregnancy.  Ovary: cysts or tumors.  Pelvic adhesions (scar tissue).  Endometriosis (uterus lining tissue growing in the pelvis and on the pelvic organs).  Pelvic congestion syndrome (female organs filling up with blood just before the menstrual period).  Pain with the menstrual period.  Pain with ovulation (producing an egg).  Pain with an IUD (intrauterine device, birth control) in the uterus.  Cancer of the female organs.  Functional pain (pain not caused by a disease, may improve without treatment).  Psychological pain.  Depression. DIAGNOSIS  Your doctor will decide the seriousness of your  pain by doing an examination.  Blood tests.  X-rays.  Ultrasound.  CT scan (computed tomography, special type of X-ray).  MRI (magnetic resonance imaging).  Cultures, for infection.  Barium enema (dye inserted in the large intestine, to better view it with X-rays).  Colonoscopy (looking in intestine with a lighted tube).  Laparoscopy (minor surgery, looking in abdomen with a lighted tube).  Major abdominal exploratory surgery (looking in abdomen with a large incision). TREATMENT  The treatment will depend on the cause of the pain.   Many cases can be observed and treated at home.  Over-the-counter medicines recommended by your caregiver.  Prescription medicine.  Antibiotics, for infection.  Birth control pills, for painful periods or for ovulation pain.  Hormone treatment, for endometriosis.  Nerve blocking injections.  Physical therapy.  Antidepressants.  Counseling with a psychologist or psychiatrist.  Minor or major surgery. HOME CARE INSTRUCTIONS   Do not take laxatives, unless directed by your caregiver.  Take over-the-counter pain medicine only if ordered by your caregiver. Do not take aspirin because it can cause an upset stomach or bleeding.  Try a clear liquid diet (broth or water) as ordered by your caregiver. Slowly move to a bland diet, as tolerated, if the pain is related to the stomach or intestine.  Have a thermometer and take your temperature several times a day, and record it.  Bed rest and sleep, if it helps the pain.  Avoid sexual intercourse, if it causes pain.  Avoid stressful situations.  Keep your follow-up appointments and tests, as your caregiver orders.  If the pain does not go away with medicine or surgery, you may try:  Acupuncture.  Relaxation exercises (yoga, meditation).  Group therapy.  Counseling. SEEK MEDICAL CARE IF:   You notice certain foods cause stomach pain.  Your home care treatment is not helping  your pain.  You need stronger pain medicine.  You want your IUD removed.  You feel faint or lightheaded.  You develop nausea and vomiting.  You develop a rash.  You are having side effects or an allergy to your medicine. SEEK IMMEDIATE MEDICAL CARE IF:   Your pain does not go away or gets worse.  You have a fever.  Your pain is felt only in portions of the abdomen. The right side could possibly be appendicitis. The left lower portion of the abdomen could be colitis or diverticulitis.  You are passing blood in your stools (bright red or black tarry stools, with or without vomiting).  You have blood in your urine.  You develop chills, with or without a fever.  You pass out. MAKE SURE YOU:   Understand these instructions.  Will watch your condition.  Will get help right away if you are not doing well or get worse. Document Released: 08/16/2007 Document Revised: 01/11/2012 Document Reviewed: 09/05/2009 [facility]ExitCare Patient Information 2014 [place], [place]. Bacterial Vaginosis Bacterial vaginosis is a vaginal infection that occurs when the normal balance of bacteria in the vagina is disrupted. It results from an overgrowth of certain bacteria. This is the most common vaginal infection in women of childbearing age. Treatment is important to prevent complications, especially in pregnant women, as it can cause a premature delivery. CAUSES  Bacterial vaginosis is caused by an increase in harmful bacteria that are normally present in smaller amounts in the vagina. Several different kinds of bacteria can cause bacterial vaginosis. However, the reason that the condition develops is not fully understood. RISK FACTORS Certain activities or behaviors can put you at an increased risk of developing bacterial vaginosis, including:  Having a new sex partner or multiple sex partners.  Douching.  Using an intrauterine device (IUD) for contraception. Women do not get bacterial vaginosis from  toilet seats, bedding, swimming pools, or contact with objects around them. SIGNS AND SYMPTOMS  Some women with bacterial vaginosis have no signs or symptoms. Common symptoms include:  Grey vaginal discharge.  A fishlike odor with discharge, especially after sexual intercourse.  Itching or burning of the vagina and vulva.  Burning or pain with urination. DIAGNOSIS  Your health care provider will take a medical history and examine the vagina for signs of bacterial vaginosis. A sample of vaginal fluid may be taken. Your health care provider will look at this sample under a microscope to check for bacteria and abnormal cells. A vaginal pH test may also be done.  TREATMENT  Bacterial vaginosis may be treated with antibiotic medicines. These may be given in the form of a pill or a vaginal cream. A second round of antibiotics may be prescribed if the condition comes back after treatment.  HOME CARE INSTRUCTIONS   Only take over-the-counter or prescription medicines as directed by your health care provider.  If antibiotic medicine was prescribed, take it as directed. Make sure you finish it even if you start to feel better.  Do not have sex until treatment is completed.  Tell all sexual partners that you have a vaginal infection. They should see their health care provider and be treated if they have problems, such as a mild rash or itching.  Practice safe sex by using condoms and only having one sex partner. SEEK MEDICAL CARE IF:  Your symptoms are not improving after 3 days of treatment.  You have increased discharge or pain.  You have a fever. MAKE SURE YOU:   Understand these instructions.  Will watch your condition.  Will get help right away if you are not doing well or get worse. FOR MORE INFORMATION  Centers for Disease Control and Prevention, Division of STD Prevention: SolutionApps.co.zawww.cdc.gov/std American Sexual Health Association (ASHA): www.ashastd.org  Document Released:  10/19/2005 Document Revised: 08/09/2013 Document Reviewed: 05/31/2013 [facility]Exit[facility] Patient Information 2014 [place]Care, [place].

## 2014-02-20 LAB — GC/CHLAMYDIA PROBE AMP
CT PROBE, AMP APTIMA: NEGATIVE
GC PROBE AMP APTIMA: NEGATIVE

## 2014-02-20 NOTE — ED Provider Notes (Signed)
Medical screening examination/treatment/procedure(s) were performed by non-physician practitioner and as supervising physician I was immediately available for consultation/collaboration.   EKG Interpretation None       Suvi Archuletta F Enyah Moman, MD 02/20/14 1327 

## 2014-03-22 ENCOUNTER — Encounter (HOSPITAL_BASED_OUTPATIENT_CLINIC_OR_DEPARTMENT_OTHER): Payer: Self-pay | Admitting: Emergency Medicine

## 2014-03-22 ENCOUNTER — Emergency Department (HOSPITAL_BASED_OUTPATIENT_CLINIC_OR_DEPARTMENT_OTHER)
Admission: EM | Admit: 2014-03-22 | Discharge: 2014-03-22 | Disposition: A | Discharge status: Home / Self Care | Payer: BC Managed Care – PPO | Attending: Emergency Medicine | Admitting: Emergency Medicine

## 2014-03-22 DIAGNOSIS — Z792 Long term (current) use of antibiotics: Secondary | ICD-10-CM | POA: Insufficient documentation

## 2014-03-22 DIAGNOSIS — Z79899 Other long term (current) drug therapy: Secondary | ICD-10-CM | POA: Insufficient documentation

## 2014-03-22 DIAGNOSIS — R1110 Vomiting, unspecified: Secondary | ICD-10-CM

## 2014-03-22 DIAGNOSIS — Z8709 Personal history of other diseases of the respiratory system: Secondary | ICD-10-CM | POA: Insufficient documentation

## 2014-03-22 DIAGNOSIS — R197 Diarrhea, unspecified: Secondary | ICD-10-CM | POA: Insufficient documentation

## 2014-03-22 DIAGNOSIS — Z3202 Encounter for pregnancy test, result negative: Secondary | ICD-10-CM | POA: Insufficient documentation

## 2014-03-22 DIAGNOSIS — N390 Urinary tract infection, site not specified: Secondary | ICD-10-CM | POA: Insufficient documentation

## 2014-03-22 LAB — URINE MICROSCOPIC-ADD ON

## 2014-03-22 LAB — URINALYSIS, ROUTINE W REFLEX MICROSCOPIC
BILIRUBIN URINE: NEGATIVE
GLUCOSE, UA: NEGATIVE mg/dL
Hgb urine dipstick: NEGATIVE
Ketones, ur: 15 mg/dL — AB
Nitrite: NEGATIVE
PH: 6.5 (ref 5.0–8.0)
Protein, ur: NEGATIVE mg/dL
Specific Gravity, Urine: 1.025 (ref 1.005–1.030)
Urobilinogen, UA: 1 mg/dL (ref 0.0–1.0)

## 2014-03-22 LAB — PREGNANCY, URINE: Preg Test, Ur: NEGATIVE

## 2014-03-22 MED ORDER — PROMETHAZINE HCL 25 MG PO TABS
50.0000 mg | ORAL_TABLET | Freq: Once | ORAL | Status: AC
  Administered 2014-03-22: 50 mg via ORAL
  Filled 2014-03-22: qty 2

## 2014-03-22 MED ORDER — NITROFURANTOIN MONOHYD MACRO 100 MG PO CAPS: 100.0000 mg | ORAL_CAPSULE | Freq: Two times a day (BID) | ORAL | Status: DC

## 2014-03-22 MED ORDER — PROMETHAZINE HCL 25 MG PO TABS: 25.0000 mg | ORAL_TABLET | Freq: Four times a day (QID) | ORAL | Status: DC | PRN

## 2014-03-22 NOTE — Discharge Instructions (Signed)
Urinary Tract Infection Urinary tract infections (UTIs) can develop anywhere along your urinary tract. Your urinary tract is your body's drainage system for removing wastes and extra water. Your urinary tract includes two kidneys, two ureters, a bladder, and a urethra. Your kidneys are a pair of bean-shaped organs. Each kidney is about the size of your fist. They are located below your ribs, one on each side of your spine. CAUSES Infections are caused by microbes, which are microscopic organisms, including fungi, viruses, and bacteria. These organisms are so small that they can only be seen through a microscope. Bacteria are the microbes that most commonly cause UTIs. SYMPTOMS  Symptoms of UTIs may vary by age and gender of the patient and by the location of the infection. Symptoms in young women typically include a frequent and intense urge to urinate and a painful, burning feeling in the bladder or urethra during urination. Older women and men are more likely to be tired, shaky, and weak and have muscle aches and abdominal pain. A fever may mean the infection is in your kidneys. Other symptoms of a kidney infection include pain in your back or sides below the ribs, nausea, and vomiting. DIAGNOSIS To diagnose a UTI, your caregiver will ask you about your symptoms. Your caregiver also will ask to provide a urine sample. The urine sample will be tested for bacteria and white blood cells. White blood cells are made by your body to help fight infection. TREATMENT  Typically, UTIs can be treated with medication. Because most UTIs are caused by a bacterial infection, they usually can be treated with the use of antibiotics. The choice of antibiotic and length of treatment depend on your symptoms and the type of bacteria causing your infection. HOME CARE INSTRUCTIONS  If you were prescribed antibiotics, take them exactly as your caregiver instructs you. Finish the medication even if you feel better after you  have only taken some of the medication.  Drink enough water and fluids to keep your urine clear or pale yellow.  Avoid caffeine, tea, and carbonated beverages. They tend to irritate your bladder.  Empty your bladder often. Avoid holding urine for long periods of time.  Empty your bladder before and after sexual intercourse.  After a bowel movement, women should cleanse from front to back. Use each tissue only once. SEEK MEDICAL CARE IF:   You have back pain.  You develop a fever.  Your symptoms do not begin to resolve within 3 days. SEEK IMMEDIATE MEDICAL CARE IF:   You have severe back pain or lower abdominal pain.  You develop chills.  You have nausea or vomiting.  You have continued burning or discomfort with urination. MAKE SURE YOU:   Understand these instructions.  Will watch your condition.  Will get help right away if you are not doing well or get worse. Document Released: 07/29/2005 Document Revised: 04/19/2012 Document Reviewed: 11/27/2011 [facility]Exit[facility] Patient Information 2014 [place]ExitCare, [place]LLC. Possible Viral Gastroenteritis Viral gastroenteritis is also known as stomach flu. This condition affects the stomach and intestinal tract. It can cause sudden diarrhea and vomiting. The illness typically lasts 3 to 8 days. Most people develop an immune response that eventually gets rid of the virus. While this natural response develops, the virus can make you quite ill. CAUSES  Many different viruses can cause gastroenteritis, such as rotavirus or noroviruses. You can catch one of these viruses by consuming contaminated food or water. You may also catch a virus by sharing utensils or other  personal items with an infected person or by touching a contaminated surface. SYMPTOMS  The most common symptoms are diarrhea and vomiting. These problems can cause a severe loss of body fluids (dehydration) and a body salt (electrolyte) imbalance. Other symptoms may  include:  Fever.  Headache.  Fatigue.  Abdominal pain. DIAGNOSIS  Your caregiver can usually diagnose viral gastroenteritis based on your symptoms and a physical exam. A stool sample may also be taken to test for the presence of viruses or other infections. TREATMENT  This illness typically goes away on its own. Treatments are aimed at rehydration. The most serious cases of viral gastroenteritis involve vomiting so severely that you are not able to keep fluids down. In these cases, fluids must be given through an intravenous line (IV). HOME CARE INSTRUCTIONS   Drink enough fluids to keep your urine clear or pale yellow. Drink small amounts of fluids frequently and increase the amounts as tolerated.  Ask your caregiver for specific rehydration instructions.  Avoid:  Foods high in sugar.  Alcohol.  Carbonated drinks.  Tobacco.  Juice.  Caffeine drinks.  Extremely hot or cold fluids.  Fatty, greasy foods.  Too much intake of anything at one time.  Dairy products until 24 to 48 hours after diarrhea stops.  You may consume probiotics. Probiotics are active cultures of beneficial bacteria. They may lessen the amount and number of diarrheal stools in adults. Probiotics can be found in yogurt with active cultures and in supplements.  Wash your hands well to avoid spreading the virus.  Only take over-the-counter or prescription medicines for pain, discomfort, or fever as directed by your caregiver. Do not give aspirin to children. Antidiarrheal medicines are not recommended.  Ask your caregiver if you should continue to take your regular prescribed and over-the-counter medicines.  Keep all follow-up appointments as directed by your caregiver. SEEK IMMEDIATE MEDICAL CARE IF:   You are unable to keep fluids down.  You do not urinate at least once every 6 to 8 hours.  You develop shortness of breath.  You notice blood in your stool or vomit. This may look like coffee  grounds.  You have abdominal pain that increases or is concentrated in one small area (localized).  You have persistent vomiting or diarrhea.  You have a fever.  The patient is a child younger than 3 months, and he or she has a fever.  The patient is a child older than 3 months, and he or she has a fever and persistent symptoms.  The patient is a child older than 3 months, and he or she has a fever and symptoms suddenly get worse.  The patient is a baby, and he or she has no tears when crying. MAKE SURE YOU:   Understand these instructions.  Will watch your condition.  Will get help right away if you are not doing well or get worse. Document Released: 10/19/2005 Document Revised: 01/11/2012 Document Reviewed: 08/05/2011 [facility] Patient Information 2014 [place].

## 2014-03-22 NOTE — ED Notes (Signed)
Pt. Reports no abd. Pain and only nausea and vomiting x 2-3 times today.

## 2014-03-22 NOTE — ED Provider Notes (Signed)
TIME SEEN: 5:42 PM  CHIEF COMPLAINT: Vomiting, diarrhea  HPI: Patient is a 23 y.o. F  With history of prior UTI, pyelonephritis, chlamydia who presents to the emergency department with 2 episodes of nonbloody, nonbilious vomiting and one episode of nonbloody diarrhea today. She states she had chills earlier this morning but no fever. No abdominal pain. She denies any vaginal bleeding or discharge. Her last menstrual period was 2-3 weeks ago. No dysuria or hematuria. No bloody stool or melena. No sick contacts or recent travel, hospitalization or antibiotic use.    ROS: See HPI Constitutional: no fever  Eyes: no drainage  ENT: no runny nose   Cardiovascular:  no chest pain  Resp: no SOB  GI: no vomiting GU: no dysuria Integumentary: no rash  Allergy: no hives  Musculoskeletal: no leg swelling  Neurological: no slurred speech ROS otherwise negative  PAST MEDICAL HISTORY/PAST SURGICAL HISTORY:  Past Medical History  Diagnosis Date  . Sinus infection   . UTI (lower urinary tract infection)   . Pyelonephritis   . Chlamydia     MEDICATIONS:  Prior to Admission medications   Medication Sig Start Date End Date Taking? Authorizing Provider  dicyclomine (BENTYL) 20 MG tablet Take 1 tablet (20 mg total) by mouth every 6 (six) hours as needed for spasms (for abdominal cramping). 12/07/13   Olivia Mackielga M Otter, MD  dicyclomine (BENTYL) 20 MG tablet Take 1 tablet (20 mg total) by mouth 2 (two) times daily. 02/19/14   Tatyana A Kirichenko, PA-C  metroNIDAZOLE (FLAGYL) 500 MG tablet Take 1 tablet (500 mg total) by mouth 2 (two) times daily. 02/19/14   Tatyana A Kirichenko, PA-C  ondansetron (ZOFRAN ODT) 8 MG disintegrating tablet Take 1 tablet (8 mg total) by mouth every 8 (eight) hours as needed for nausea or vomiting. 12/07/13   Olivia Mackielga M Otter, MD  promethazine (PHENERGAN) 25 MG tablet Take 1 tablet (25 mg total) by mouth every 6 (six) hours as needed for nausea. 01/19/14   Juliet RudeNathan R. Pickering, MD  traMADol  (ULTRAM) 50 MG tablet Take 1 tablet (50 mg total) by mouth every 6 (six) hours as needed. 02/19/14   Tatyana A Kirichenko, PA-C    ALLERGIES:  No Known Allergies  SOCIAL HISTORY:  History  Substance Use Topics  . Smoking status: Never Smoker   . Smokeless tobacco: Not on file  . Alcohol Use: No    FAMILY HISTORY: No family history on file.  EXAM: BP 102/63  Pulse 75  Temp(Src) 99.3 F (37.4 C) (Oral)  Resp 18  Ht 5\' 4"  (1.626 m)  Wt 103 lb (46.72 kg)  BMI 17.67 kg/m2  SpO2 100%  LMP 02/20/2014 CONSTITUTIONAL: Alert and oriented and responds appropriately to questions. Well-appearing; well-nourished, nontoxic, well-hydrated HEAD: Normocephalic EYES: Conjunctivae clear, PERRL ENT: normal nose; no rhinorrhea; moist mucous membranes; pharynx without lesions noted NECK: Supple, no meningismus, no LAD  CARD: RRR; S1 and S2 appreciated; no murmurs, no clicks, no rubs, no gallops RESP: Normal chest excursion without splinting or tachypnea; breath sounds clear and equal bilaterally; no wheezes, no rhonchi, no rales,  ABD/GI: Normal bowel sounds; non-distended; soft, non-tender, no rebound, no guarding BACK:  The back appears normal and is non-tender to palpation, there is no CVA tenderness EXT: Normal ROM in all joints; non-tender to palpation; no edema; normal capillary refill; no cyanosis    SKIN: Normal color for age and race; warm NEURO: Moves all extremities equally PSYCH: The patient's mood and manner are appropriate.  Grooming and personal  hygiene are appropriate.  MEDICAL DECISION MAKING: Patient here with vomiting and diarrhea. Suspect viral illness. Her abdominal exam is completely benign. She is well-appearing, nontoxic and well-hydrated. Have discussed with patient given she has only had 2 episodes of vomiting and one episode of diarrhea, I do not feel she is dehydrated this time. I do not feel labs will add any extra information that would change her disposition. I do not  feel she needs abdominal imaging given her abdominal exam is completely benign. Will obtain urinalysis, urine pregnancy. Patient is asking for oral Phenergan. Anticipate patient can be discharged home.  ED PROGRESS: Patient has been able to tolerate drinking ginger ale without difficulty. She is still very well-appearing, pleasant, nontoxic with benign exam.  Have discussed with patient that I feel she likely has a very benign viral illness and will discharge her with Phenergan. She may also have a urinary tract infection. She is no CVA tenderness or signs of pyelonephritis on exam. She has no prior urine culture here. Will discharge home on Macrobid for 3 days. Discussed return precautions and supportive care instructions. She verbalizes understanding and is comfortable plan.     Layla MawKristen N Ward, DO 03/22/14 [id number]

## 2014-03-24 LAB — URINE CULTURE

## 2014-05-17 ENCOUNTER — Encounter (HOSPITAL_BASED_OUTPATIENT_CLINIC_OR_DEPARTMENT_OTHER): Payer: Self-pay | Admitting: Emergency Medicine

## 2014-05-17 ENCOUNTER — Emergency Department (HOSPITAL_BASED_OUTPATIENT_CLINIC_OR_DEPARTMENT_OTHER)
Admission: EM | Admit: 2014-05-17 | Discharge: 2014-05-17 | Disposition: A | Discharge status: Home / Self Care | Payer: BC Managed Care – PPO | Attending: Emergency Medicine | Admitting: Emergency Medicine

## 2014-05-17 DIAGNOSIS — Z3202 Encounter for pregnancy test, result negative: Secondary | ICD-10-CM | POA: Insufficient documentation

## 2014-05-17 DIAGNOSIS — Z79899 Other long term (current) drug therapy: Secondary | ICD-10-CM | POA: Insufficient documentation

## 2014-05-17 DIAGNOSIS — Z87440 Personal history of urinary (tract) infections: Secondary | ICD-10-CM | POA: Insufficient documentation

## 2014-05-17 DIAGNOSIS — Z8619 Personal history of other infectious and parasitic diseases: Secondary | ICD-10-CM | POA: Insufficient documentation

## 2014-05-17 DIAGNOSIS — R1115 Cyclical vomiting syndrome unrelated to migraine: Secondary | ICD-10-CM

## 2014-05-17 DIAGNOSIS — Z8709 Personal history of other diseases of the respiratory system: Secondary | ICD-10-CM | POA: Insufficient documentation

## 2014-05-17 DIAGNOSIS — Z8742 Personal history of other diseases of the female genital tract: Secondary | ICD-10-CM | POA: Insufficient documentation

## 2014-05-17 DIAGNOSIS — Z792 Long term (current) use of antibiotics: Secondary | ICD-10-CM | POA: Insufficient documentation

## 2014-05-17 LAB — URINALYSIS, ROUTINE W REFLEX MICROSCOPIC
BILIRUBIN URINE: NEGATIVE
Glucose, UA: NEGATIVE mg/dL
HGB URINE DIPSTICK: NEGATIVE
Ketones, ur: NEGATIVE mg/dL
Leukocytes, UA: NEGATIVE
Nitrite: NEGATIVE
PH: 8 (ref 5.0–8.0)
Protein, ur: NEGATIVE mg/dL
SPECIFIC GRAVITY, URINE: 1.024 (ref 1.005–1.030)
Urobilinogen, UA: 1 mg/dL (ref 0.0–1.0)

## 2014-05-17 LAB — PREGNANCY, URINE: Preg Test, Ur: NEGATIVE

## 2014-05-17 MED ORDER — PROMETHAZINE HCL 25 MG/ML IJ SOLN
25.0000 mg | Freq: Once | INTRAMUSCULAR | Status: AC
  Administered 2014-05-17: 25 mg via INTRAVENOUS
  Filled 2014-05-17: qty 1

## 2014-05-17 MED ORDER — PROMETHAZINE HCL 25 MG PO TABS: 25.0000 mg | ORAL_TABLET | Freq: Four times a day (QID) | ORAL | Status: DC | PRN

## 2014-05-17 MED ORDER — SODIUM CHLORIDE 0.9 % IV SOLN
Freq: Once | INTRAVENOUS | Status: AC
  Administered 2014-05-17: 16:00:00 via INTRAVENOUS

## 2014-05-17 MED ORDER — ONDANSETRON HCL 4 MG/2ML IJ SOLN
4.0000 mg | Freq: Once | INTRAMUSCULAR | Status: DC
  Filled 2014-05-17: qty 2

## 2014-05-17 NOTE — ED Notes (Signed)
Vomiting x 3 days. Hx of same and was referred to a GI MD and they refused to see her.

## 2014-05-17 NOTE — Discharge Instructions (Signed)

## 2014-05-17 NOTE — ED Provider Notes (Signed)
CSN: [id number]     Arrival date & time 05/17/14  1444 History   First MD Initiated Contact with Patient 05/17/14 1547     Chief Complaint  Patient presents with  . Emesis     (Consider location/radiation/quality/duration/timing/severity/associated sxs/prior Treatment) Patient is a 23 y.o. female presenting with vomiting. The history is provided by the patient. No language interpreter was used.  Emesis Severity:  Moderate Duration:  2 days Timing:  Constant Number of daily episodes:  Multiple Able to tolerate:  Liquids Progression:  Worsening Chronicity:  New Recent urination:  Normal Relieved by:  Nothing Worsened by:  Nothing tried Ineffective treatments:  None tried Associated symptoms: no abdominal pain   Risk factors: no alcohol use and no diabetes    Pt has a history of nausea.   Pt reports she was referred to Gi but they would not see her. Past Medical History  Diagnosis Date  . Sinus infection   . UTI (lower urinary tract infection)   . Pyelonephritis   . Chlamydia    History reviewed. No pertinent past surgical history. No family history on file. History  Substance Use Topics  . Smoking status: Never Smoker   . Smokeless tobacco: Not on file  . Alcohol Use: No   OB History   Grav Para Term Preterm Abortions TAB SAB Ect Mult Living                 Review of Systems  Gastrointestinal: Positive for vomiting. Negative for abdominal pain.  All other systems reviewed and are negative.     Allergies  Review of patient's allergies indicates no known allergies.  Home Medications   Prior to Admission medications   Medication Sig Start Date End Date Taking? Authorizing Provider  dicyclomine (BENTYL) 20 MG tablet Take 1 tablet (20 mg total) by mouth every 6 (six) hours as needed for spasms (for abdominal cramping). 12/07/13   Olivia Mackielga M Otter, MD  dicyclomine (BENTYL) 20 MG tablet Take 1 tablet (20 mg total) by mouth 2 (two) times daily. 02/19/14   Tatyana A  Kirichenko, PA-C  metroNIDAZOLE (FLAGYL) 500 MG tablet Take 1 tablet (500 mg total) by mouth 2 (two) times daily. 02/19/14   Tatyana A Kirichenko, PA-C  nitrofurantoin, macrocrystal-monohydrate, (MACROBID) 100 MG capsule Take 1 capsule (100 mg total) by mouth 2 (two) times daily. 03/22/14   Kristen N Ward, DO  ondansetron (ZOFRAN ODT) 8 MG disintegrating tablet Take 1 tablet (8 mg total) by mouth every 8 (eight) hours as needed for nausea or vomiting. 12/07/13   Olivia Mackielga M Otter, MD  promethazine (PHENERGAN) 25 MG tablet Take 1 tablet (25 mg total) by mouth every 6 (six) hours as needed for nausea. 01/19/14   Juliet RudeNathan R. Rubin PayorPickering, MD  promethazine (PHENERGAN) 25 MG tablet Take 1 tablet (25 mg total) by mouth every 6 (six) hours as needed for nausea or vomiting. 03/22/14   Kristen N Ward, DO  traMADol (ULTRAM) 50 MG tablet Take 1 tablet (50 mg total) by mouth every 6 (six) hours as needed. 02/19/14   Tatyana A Kirichenko, PA-C   BP 104/70  Pulse 86  Temp(Src) 98.3 F (36.8 C) (Oral)  Resp 18  Ht 5\' 4"  (1.626 m)  Wt 103 lb (46.72 kg)  BMI 17.67 kg/m2  SpO2 100%  LMP 04/25/2014 Physical Exam  Nursing note and vitals reviewed. HENT:  Head: Normocephalic.  Right Ear: External ear normal.  Left Ear: External ear normal.  Eyes: Conjunctivae are normal.  Pupils are equal, round, and reactive to light.  Neck: Normal range of motion. Neck supple.  Cardiovascular: Normal rate.   Pulmonary/Chest: Effort normal.  Abdominal: Soft. There is no tenderness.  Musculoskeletal: Normal range of motion.  Neurological: She is alert.  Skin: Skin is warm.    ED Course  Procedures (including critical care time) Labs Review Labs Reviewed  URINALYSIS, ROUTINE W REFLEX MICROSCOPIC  PREGNANCY, URINE    Imaging Review No results found.   EKG Interpretation None      MDM Pt given Iv fluid and phenergan.   Pt given rx for phenergan   Final diagnoses:  Non-intractable cyclical vomiting with nausea   Resource  guide   Elson Areas, PA-C 05/17/14 1757

## 2014-05-17 NOTE — ED Notes (Signed)
Pt. Reported to RN before phenergan order that the zofran ordered would not work for her.  RN reported to EDP and phenergan was then ordered for the Pt.

## 2014-05-20 NOTE — ED Provider Notes (Signed)
Medical screening examination/treatment/procedure(s) were performed by non-physician practitioner and as supervising physician I was immediately available for consultation/collaboration.   EKG Interpretation None        Audree CamelScott T Julene Rahn, MD 05/20/14 1102

## 2014-06-11 ENCOUNTER — Emergency Department (HOSPITAL_BASED_OUTPATIENT_CLINIC_OR_DEPARTMENT_OTHER)
Admission: EM | Admit: 2014-06-11 | Discharge: 2014-06-11 | Disposition: A | Discharge status: Home / Self Care | Payer: BC Managed Care – PPO | Attending: Emergency Medicine | Admitting: Emergency Medicine

## 2014-06-11 ENCOUNTER — Encounter (HOSPITAL_BASED_OUTPATIENT_CLINIC_OR_DEPARTMENT_OTHER): Payer: Self-pay | Admitting: Emergency Medicine

## 2014-06-11 DIAGNOSIS — Z8709 Personal history of other diseases of the respiratory system: Secondary | ICD-10-CM | POA: Insufficient documentation

## 2014-06-11 DIAGNOSIS — Z8619 Personal history of other infectious and parasitic diseases: Secondary | ICD-10-CM | POA: Insufficient documentation

## 2014-06-11 DIAGNOSIS — O210 Mild hyperemesis gravidarum: Secondary | ICD-10-CM | POA: Insufficient documentation

## 2014-06-11 DIAGNOSIS — Z792 Long term (current) use of antibiotics: Secondary | ICD-10-CM | POA: Insufficient documentation

## 2014-06-11 DIAGNOSIS — Z79899 Other long term (current) drug therapy: Secondary | ICD-10-CM | POA: Insufficient documentation

## 2014-06-11 DIAGNOSIS — Z8742 Personal history of other diseases of the female genital tract: Secondary | ICD-10-CM | POA: Insufficient documentation

## 2014-06-11 DIAGNOSIS — O219 Vomiting of pregnancy, unspecified: Secondary | ICD-10-CM

## 2014-06-11 DIAGNOSIS — Z87440 Personal history of urinary (tract) infections: Secondary | ICD-10-CM | POA: Insufficient documentation

## 2014-06-11 LAB — URINALYSIS, ROUTINE W REFLEX MICROSCOPIC
GLUCOSE, UA: NEGATIVE mg/dL
Hgb urine dipstick: NEGATIVE
Leukocytes, UA: NEGATIVE
NITRITE: NEGATIVE
PH: 5.5 (ref 5.0–8.0)
PROTEIN: NEGATIVE mg/dL
Specific Gravity, Urine: 1.035 — ABNORMAL HIGH (ref 1.005–1.030)
Urobilinogen, UA: 1 mg/dL (ref 0.0–1.0)

## 2014-06-11 LAB — PREGNANCY, URINE: Preg Test, Ur: POSITIVE — AB

## 2014-06-11 MED ORDER — SODIUM CHLORIDE 0.9 % IV BOLUS (SEPSIS)
1000.0000 mL | Freq: Once | INTRAVENOUS | Status: AC
  Administered 2014-06-11: 1000 mL via INTRAVENOUS

## 2014-06-11 MED ORDER — DOXYLAMINE-PYRIDOXINE 10-10 MG PO TBEC: DELAYED_RELEASE_TABLET | ORAL | Status: DC

## 2014-06-11 NOTE — ED Notes (Signed)
Vomited x 4 yesterday, denies fever or diarrhea. Pt states she is pregnant, LMP 6-24

## 2014-06-11 NOTE — ED Provider Notes (Signed)
CSN: [id number]     Arrival date & time 06/11/14  0404 History   First MD Initiated Contact with Patient 06/11/14 0414     Chief Complaint  Patient presents with  . Vomiting      (Consider location/radiation/quality/duration/timing/severity/associated sxs/prior Treatment) HPI This is a 23 year old female who states she recently tested positive for pregnancy by her Ob/Gyn. She is here with nausea and vomiting that began about 36 hours ago. She has vomited 4 times during that period of time. She states that any attempt to eat or drink results and vomiting and she has not had anything to eat or drink since then. She believes she may be dehydrated. She denies abdominal pain or diarrhea. She denies fever or chills. She denies dysuria, vaginal bleeding or vaginal discharge.  Past Medical History  Diagnosis Date  . Sinus infection   . UTI (lower urinary tract infection)   . Pyelonephritis   . Chlamydia    History reviewed. No pertinent past surgical history. History reviewed. No pertinent family history. History  Substance Use Topics  . Smoking status: Never Smoker   . Smokeless tobacco: Not on file  . Alcohol Use: No   OB History   Grav Para Term Preterm Abortions TAB SAB Ect Mult Living   1              Review of Systems  All other systems reviewed and are negative.   Allergies  Review of patient's allergies indicates no known allergies.  Home Medications   Prior to Admission medications   Medication Sig Start Date End Date Taking? Authorizing Provider  dicyclomine (BENTYL) 20 MG tablet Take 1 tablet (20 mg total) by mouth every 6 (six) hours as needed for spasms (for abdominal cramping). 12/07/13   Olivia Mackielga M Otter, MD  dicyclomine (BENTYL) 20 MG tablet Take 1 tablet (20 mg total) by mouth 2 (two) times daily. 02/19/14   Tatyana A Kirichenko, PA-C  metroNIDAZOLE (FLAGYL) 500 MG tablet Take 1 tablet (500 mg total) by mouth 2 (two) times daily. 02/19/14   Tatyana A Kirichenko, PA-C   nitrofurantoin, macrocrystal-monohydrate, (MACROBID) 100 MG capsule Take 1 capsule (100 mg total) by mouth 2 (two) times daily. 03/22/14   Kristen N Ward, DO  ondansetron (ZOFRAN ODT) 8 MG disintegrating tablet Take 1 tablet (8 mg total) by mouth every 8 (eight) hours as needed for nausea or vomiting. 12/07/13   Olivia Mackielga M Otter, MD  promethazine (PHENERGAN) 25 MG tablet Take 1 tablet (25 mg total) by mouth every 6 (six) hours as needed for nausea. 01/19/14   Juliet RudeNathan R. Rubin PayorPickering, MD  promethazine (PHENERGAN) 25 MG tablet Take 1 tablet (25 mg total) by mouth every 6 (six) hours as needed for nausea or vomiting. 03/22/14   Kristen N Ward, DO  promethazine (PHENERGAN) 25 MG tablet Take 1 tablet (25 mg total) by mouth every 6 (six) hours as needed for nausea or vomiting. 05/17/14   Elson AreasLeslie K Sofia, PA-C  traMADol (ULTRAM) 50 MG tablet Take 1 tablet (50 mg total) by mouth every 6 (six) hours as needed. 02/19/14   Tatyana A Kirichenko, PA-C   BP 120/68  Pulse 79  Temp(Src) 98.4 F (36.9 C) (Oral)  Resp 20  Ht 5\' 4"  (1.626 m)  Wt 109 lb (49.442 kg)  BMI 18.70 kg/m2  SpO2 97%  LMP 04/25/2014  Physical Exam General: Well-developed, well-nourished female in no acute distress; appearance consistent with age of record HENT: normocephalic; atraumatic; mucous membranes moist Eyes:  pupils equal, round and reactive to light; extraocular muscles intact Neck: supple Heart: regular rate and rhythm Lungs: clear to auscultation bilaterally Abdomen: soft; nondistended; nontender; no masses or hepatosplenomegaly; bowel sounds present Extremities: No deformity; full range of motion; pulses normal; no edema Neurologic: Awake, alert and oriented; motor function intact in all extremities and symmetric; no facial droop Skin: Warm and dry Psychiatric: Flat affect    ED Course  Procedures (including critical care time)  MDM   Nursing notes and vitals signs, including pulse oximetry, reviewed.  Summary of this  visit's results, reviewed by myself:  Labs:  Results for orders placed during the hospital encounter of 06/11/14 (from the past 24 hour(s))  URINALYSIS, ROUTINE W REFLEX MICROSCOPIC     Status: Abnormal   Collection Time    06/11/14  4:10 AM      Result Value Ref Range   Color, Urine YELLOW  YELLOW   APPearance CLEAR  CLEAR   Specific Gravity, Urine 1.035 (*) 1.005 - 1.030   pH 5.5  5.0 - 8.0   Glucose, UA NEGATIVE  NEGATIVE mg/dL   Hgb urine dipstick NEGATIVE  NEGATIVE   Bilirubin Urine SMALL (*) NEGATIVE   Ketones, ur >80 (*) NEGATIVE mg/dL   Protein, ur NEGATIVE  NEGATIVE mg/dL   Urobilinogen, UA 1.0  0.0 - 1.0 mg/dL   Nitrite NEGATIVE  NEGATIVE   Leukocytes, UA NEGATIVE  NEGATIVE  PREGNANCY, URINE     Status: Abnormal   Collection Time    06/11/14  4:10 AM      Result Value Ref Range   Preg Test, Ur POSITIVE (*) NEGATIVE   5:33 AM Feels better after IV fluid bolus. Avoid Zofran due to potential concerns of first trimester teratogenicity. Will prescribe doxylamine/pyridoxine.     Hanley Seamen, MD 06/11/14 [phone]

## 2014-06-11 NOTE — ED Notes (Signed)
MD at bedside. 

## 2014-07-16 LAB — OB RESULTS CONSOLE ABO/RH

## 2014-07-16 LAB — OB RESULTS CONSOLE RUBELLA ANTIBODY, IGM: Rubella: IMMUNE

## 2014-07-16 LAB — OB RESULTS CONSOLE HIV ANTIBODY (ROUTINE TESTING): HIV: NONREACTIVE

## 2014-07-16 LAB — OB RESULTS CONSOLE GC/CHLAMYDIA
Chlamydia: NEGATIVE
Gonorrhea: NEGATIVE

## 2014-07-16 LAB — OB RESULTS CONSOLE RPR: RPR: NONREACTIVE

## 2014-07-16 LAB — OB RESULTS CONSOLE ANTIBODY SCREEN: Antibody Screen: NEGATIVE

## 2014-07-16 LAB — OB RESULTS CONSOLE HEPATITIS B SURFACE ANTIGEN: Hepatitis B Surface Ag: NEGATIVE

## 2014-09-03 ENCOUNTER — Encounter (HOSPITAL_BASED_OUTPATIENT_CLINIC_OR_DEPARTMENT_OTHER): Payer: Self-pay | Admitting: Emergency Medicine

## 2014-10-09 ENCOUNTER — Inpatient Hospital Stay (HOSPITAL_COMMUNITY)
Admission: AD | Admit: 2014-10-09 | Payer: BC Managed Care – PPO | Source: Ambulatory Visit | Admitting: Obstetrics and Gynecology

## 2014-11-02 NOTE — L&D Delivery Note (Addendum)
Patient was C/C/+2 and pushed for 16 minutes with epidural.   NSVD  female infant, Apgars 8,9, weight 5#3.   The patient had no lacerations. Fundus was firm. EBL was expected amount. Placenta was delivered intact. Vagina was clear.  Baby was vigorous and doing skin to skin with mother.  Hannah Fuller A

## 2014-12-27 ENCOUNTER — Encounter (HOSPITAL_COMMUNITY): Payer: Self-pay | Admitting: *Deleted

## 2014-12-27 ENCOUNTER — Inpatient Hospital Stay (EMERGENCY_DEPARTMENT_HOSPITAL)
Admission: AD | Admit: 2014-12-27 | Discharge: 2014-12-28 | Disposition: A | Discharge status: Home / Self Care | Payer: Medicaid Other | Source: Ambulatory Visit | Attending: Obstetrics and Gynecology | Admitting: Obstetrics and Gynecology

## 2014-12-27 DIAGNOSIS — O4703 False labor before 37 completed weeks of gestation, third trimester: Secondary | ICD-10-CM

## 2014-12-27 DIAGNOSIS — O212 Late vomiting of pregnancy: Secondary | ICD-10-CM

## 2014-12-27 DIAGNOSIS — Z3A35 35 weeks gestation of pregnancy: Secondary | ICD-10-CM

## 2014-12-27 DIAGNOSIS — R198 Other specified symptoms and signs involving the digestive system and abdomen: Principal | ICD-10-CM

## 2014-12-27 DIAGNOSIS — R197 Diarrhea, unspecified: Secondary | ICD-10-CM | POA: Insufficient documentation

## 2014-12-27 DIAGNOSIS — O26893 Other specified pregnancy related conditions, third trimester: Secondary | ICD-10-CM

## 2014-12-27 LAB — URINALYSIS, ROUTINE W REFLEX MICROSCOPIC
BILIRUBIN URINE: NEGATIVE
Glucose, UA: NEGATIVE mg/dL
KETONES UR: 15 mg/dL — AB
Leukocytes, UA: NEGATIVE
NITRITE: NEGATIVE
PROTEIN: NEGATIVE mg/dL
Specific Gravity, Urine: >1.03 — ABNORMAL HIGH (ref 1.005–1.030)
Urobilinogen, UA: 1 mg/dL (ref 0.0–1.0)
pH: 6 (ref 5.0–8.0)

## 2014-12-27 LAB — URINE MICROSCOPIC-ADD ON

## 2014-12-27 NOTE — MAU Note (Signed)
Pt reports vaginal bleeding x 3 hours, contractions x 3 hours.

## 2014-12-27 NOTE — MAU Provider Note (Signed)
History     CSN: [id number]  Arrival date and time: 12/27/14 2130   First Provider Initiated Contact with Patient 12/27/14 2226      Chief Complaint  Patient presents with  . Vaginal Bleeding  . Contractions   HPI Hannah Fuller 24 y.o. G1P0 complains of contractions and vaginal bleeding that started at 6pm this evening.  She notes good fetal movement.  She notices a small amt of bright red blood on her underwear and when she wipes.  The contractions are variable in intensity and occurring every 4 or 5 minutes.  Last sex was at 10pm on 2/24.  She denies LOF, fever, weakness.  She has associated nausea, vomiting, diarrhea.  Vomiting was at 6pm tonight, once only.  Diarrhea x 2 since 6pm and none before.   [facility] for preterm labor, dilated to 2.5cm and cervix was 100%.  She reports getting medicine to help the baby's lungs mature.   OB History    Gravida Para Term Preterm AB TAB SAB Ectopic Multiple Living   1               Past Medical History  Diagnosis Date  . Sinus infection   . UTI (lower urinary tract infection)   . Pyelonephritis   . Chlamydia     Past Surgical History  Procedure Laterality Date  . No past surgeries      History reviewed. No pertinent family history.  History  Substance Use Topics  . Smoking status: Never Smoker   . Smokeless tobacco: Not on file  . Alcohol Use: No    Allergies: No Known Allergies  Prescriptions prior to admission  Medication Sig Dispense Refill Last Dose  . Prenatal Vit-Fe Fumarate-FA (PRENATAL MULTIVITAMIN) TABS tablet Take 1 tablet by mouth daily at 12 noon.     . promethazine (PHENERGAN) 25 MG tablet Take 1 tablet (25 mg total) by mouth every 6 (six) hours as needed for nausea or vomiting. 15 tablet 0 12/27/2014 at Unknown time  . dicyclomine (BENTYL) 20 MG tablet Take 1 tablet (20 mg total) by mouth every 6 (six) hours as needed for spasms (for abdominal cramping). (Patient not  taking: Reported on 12/27/2014) 20 tablet 0 2 weeks  . dicyclomine (BENTYL) 20 MG tablet Take 1 tablet (20 mg total) by mouth 2 (two) times daily. (Patient not taking: Reported on 12/27/2014) 20 tablet 0   . Doxylamine-Pyridoxine 10-10 MG TBEC Take one tablet in the morning, one tablet midafternoon, and two tablets at bedtime as needed for nausea/vomiting. (Patient not taking: Reported on 12/27/2014) 60 tablet 0   . metroNIDAZOLE (FLAGYL) 500 MG tablet Take 1 tablet (500 mg total) by mouth 2 (two) times daily. (Patient not taking: Reported on 12/27/2014) 14 tablet 0   . nitrofurantoin, macrocrystal-monohydrate, (MACROBID) 100 MG capsule Take 1 capsule (100 mg total) by mouth 2 (two) times daily. (Patient not taking: Reported on 12/27/2014) 6 capsule 0   . ondansetron (ZOFRAN ODT) 8 MG disintegrating tablet Take 1 tablet (8 mg total) by mouth every 8 (eight) hours as needed for nausea or vomiting. (Patient not taking: Reported on 12/27/2014) 20 tablet 0 01/17/2014  . promethazine (PHENERGAN) 25 MG tablet Take 1 tablet (25 mg total) by mouth every 6 (six) hours as needed for nausea. (Patient not taking: Reported on 12/27/2014) 20 tablet 0   . promethazine (PHENERGAN) 25 MG tablet Take 1 tablet (25 mg total) by mouth every 6 (  six) hours as needed for nausea or vomiting. (Patient not taking: Reported on 12/27/2014) 30 tablet 0   . traMADol (ULTRAM) 50 MG tablet Take 1 tablet (50 mg total) by mouth every 6 (six) hours as needed. (Patient not taking: Reported on 12/27/2014) 15 tablet 0     ROS Pertinent ROS in HPI  Physical Exam   Blood pressure 107/62, pulse 72, temperature 98.4 F (36.9 C), temperature source Oral, resp. rate 16, height  (1.626 m), weight 128 lb (58.06 kg), last menstrual period 04/25/2014, SpO2 99 %.  Physical Exam  Constitutional: She is oriented to person, place, and time. She appears well-developed and well-nourished. No distress.  HENT:  Head: Normocephalic and atraumatic.  Eyes:  EOM are normal.  Neck: Normal range of motion.  Cardiovascular: Normal rate and regular rhythm.   Respiratory: Effort normal and breath sounds normal. No respiratory distress.  GI: Soft. Bowel sounds are normal. She exhibits no distension.  Genitourinary:  Scant dark blood seen in vagina.  NO active bleeding.  Cervix is 2.5cm and 70% effaced, posterior Rechecked cervix after one hour - no change  Musculoskeletal: Normal range of motion.  Neurological: She is alert and oriented to person, place, and time.  Skin: Skin is warm and dry.  Psychiatric: She has a normal mood and affect.   Fetal Tracing: Baseline:135 Variability:mod Accelerations: 15x15 Decelerations:none Toco: q 4-5 minutes initially - irregular and more spaced out with time    MAU Course  Procedures  MDM Discussed with Dr. Dareen Piano. He is in agreement to discharge pt with use of imodium and follow up in clinic.    Assessment and Plan  A:  1. Gastrointestinal symptoms related to pregnancy, third trimester   2. Threatened preterm labor, third trimester    P: Discharge to home Imodium for diarrhea Push PO fluids Pelvic Rest Follow up in clinic PTL precautions Patient may return to MAU as needed or if her condition were to change or worsen    Bertram Denver 12/27/2014, 10:27 PM

## 2014-12-28 DIAGNOSIS — Z3A35 35 weeks gestation of pregnancy: Secondary | ICD-10-CM

## 2014-12-28 DIAGNOSIS — R198 Other specified symptoms and signs involving the digestive system and abdomen: Secondary | ICD-10-CM

## 2014-12-28 DIAGNOSIS — O26893 Other specified pregnancy related conditions, third trimester: Secondary | ICD-10-CM

## 2014-12-28 MED ORDER — LOPERAMIDE HCL 2 MG PO CAPS
4.0000 mg | ORAL_CAPSULE | Freq: Once | ORAL | Status: AC
  Administered 2014-12-28: 4 mg via ORAL
  Filled 2014-12-28: qty 2

## 2014-12-28 NOTE — Discharge Instructions (Signed)
Preterm Labor Information °Preterm labor is when labor starts at less than 37 weeks of pregnancy. The normal length of a pregnancy is 39 to 41 weeks. °CAUSES °Often, there is no identifiable underlying cause as to why a woman goes into preterm labor. One of the most common known causes of preterm labor is infection. Infections of the uterus, cervix, vagina, amniotic sac, bladder, kidney, or even the lungs (pneumonia) can cause labor to start. Other suspected causes of preterm labor include:  °· Urogenital infections, such as yeast infections and bacterial vaginosis.   °· Uterine abnormalities (uterine shape, uterine septum, fibroids, or bleeding from the placenta).   °· A cervix that has been operated on (it may fail to stay closed).   °· Malformations in the fetus.   °· Multiple gestations (twins, triplets, and so on).   °· Breakage of the amniotic sac.   °RISK FACTORS °· Having a previous history of preterm labor.   °· Having premature rupture of membranes (PROM).   °· Having a placenta that covers the opening of the cervix (placenta previa).   °· Having a placenta that separates from the uterus (placental abruption).   °· Having a cervix that is too weak to hold the fetus in the uterus (incompetent cervix).   °· Having too much fluid in the amniotic sac (polyhydramnios).   °· Taking illegal drugs or smoking while pregnant.   °· Not gaining enough weight while pregnant.   °· Being younger than 18 and older than 24 years old.   °· Having a low socioeconomic status.   °· Being African American. °SYMPTOMS °Signs and symptoms of preterm labor include:  °· Menstrual-like cramps, abdominal pain, or back pain. °· Uterine contractions that are regular, as frequent as six in an hour, regardless of their intensity (may be mild or painful). °· Contractions that start on the top of the uterus and spread down to the lower abdomen and back.   °· A sense of increased pelvic pressure.   °· A watery or bloody mucus discharge that  comes from the vagina.   °TREATMENT °Depending on the length of the pregnancy and other circumstances, your health care provider may suggest bed rest. If necessary, there are medicines that can be given to stop contractions and to mature the fetal lungs. If labor happens before 34 weeks of pregnancy, a prolonged hospital stay may be recommended. Treatment depends on the condition of both you and the fetus.  °WHAT SHOULD YOU DO IF YOU THINK YOU ARE IN PRETERM LABOR? °Call your health care provider right away. You will need to go to the hospital to get checked immediately. °HOW CAN YOU PREVENT PRETERM LABOR IN FUTURE PREGNANCIES? °You should:  °· Stop smoking if you smoke.  °· Maintain healthy weight gain and avoid chemicals and drugs that are not necessary. °· Be watchful for any type of infection. °· Inform your health care provider if you have a known history of preterm labor. °Document Released: 01/09/2004 Document Revised: 06/21/2013 Document Reviewed: 11/21/2012 °ExitCare® Patient Information ©2015 [employer]. This information is not intended to replace advice given to you by your health care provider. Make sure you discuss any questions you have with your health care provider. ° °Pelvic Rest °Pelvic rest is sometimes recommended for women when:  °· The placenta is partially or completely covering the opening of the cervix (placenta previa). °· There is bleeding between the uterine wall and the amniotic sac in the first trimester (subchorionic hemorrhage). °· The cervix begins to open without labor starting (incompetent cervix, cervical insufficiency). °· The labor is too early (preterm   labor). °HOME CARE INSTRUCTIONS °· Do not have sexual intercourse, stimulation, or an orgasm. °· Do not use tampons, douche, or put anything in the vagina. °· Do not lift anything over 10 pounds (4.5 kg). °· Avoid strenuous activity or straining your pelvic muscles. °SEEK MEDICAL CARE IF:  °· You have any vaginal bleeding  during pregnancy. Treat this as a potential emergency. °· You have cramping pain felt low in the stomach (stronger than menstrual cramps). °· You notice vaginal discharge (watery, mucus, or bloody). °· You have a low, dull backache. °· There are regular contractions or uterine tightening. °SEEK IMMEDIATE MEDICAL CARE IF: °You have vaginal bleeding and have placenta previa.  °Document Released: 02/13/2011 Document Revised: 01/11/2012 Document Reviewed: 02/13/2011 °ExitCare® Patient Information ©2015 [employer]. This information is not intended to replace advice given to you by your health care provider. Make sure you discuss any questions you have with your health care provider. ° °

## 2014-12-29 ENCOUNTER — Inpatient Hospital Stay (HOSPITAL_COMMUNITY)
Admission: AD | Admit: 2014-12-29 | Discharge: 2015-01-01 | DRG: 775 | Disposition: A | Discharge status: Home / Self Care | Payer: Medicaid Other | Source: Ambulatory Visit | Attending: Obstetrics and Gynecology | Admitting: Obstetrics and Gynecology

## 2014-12-29 ENCOUNTER — Inpatient Hospital Stay (HOSPITAL_COMMUNITY): Payer: Medicaid Other | Admitting: Anesthesiology

## 2014-12-29 ENCOUNTER — Encounter (HOSPITAL_COMMUNITY): Payer: Self-pay | Admitting: *Deleted

## 2014-12-29 DIAGNOSIS — Z3A35 35 weeks gestation of pregnancy: Secondary | ICD-10-CM | POA: Diagnosis present

## 2014-12-29 DIAGNOSIS — O479 False labor, unspecified: Secondary | ICD-10-CM | POA: Diagnosis present

## 2014-12-29 DIAGNOSIS — O4700 False labor before 37 completed weeks of gestation, unspecified trimester: Secondary | ICD-10-CM | POA: Diagnosis present

## 2014-12-29 LAB — URINALYSIS, ROUTINE W REFLEX MICROSCOPIC
Bilirubin Urine: NEGATIVE
Glucose, UA: NEGATIVE mg/dL
KETONES UR: NEGATIVE mg/dL
NITRITE: NEGATIVE
PROTEIN: NEGATIVE mg/dL
Specific Gravity, Urine: 1.025 (ref 1.005–1.030)
Urobilinogen, UA: 0.2 mg/dL (ref 0.0–1.0)
pH: 6 (ref 5.0–8.0)

## 2014-12-29 LAB — CBC
HEMATOCRIT: 29.8 % — AB (ref 36.0–46.0)
Hemoglobin: 9.9 g/dL — ABNORMAL LOW (ref 12.0–15.0)
MCH: 32.7 pg (ref 26.0–34.0)
MCHC: 33.2 g/dL (ref 30.0–36.0)
MCV: 98.3 fL (ref 78.0–100.0)
Platelets: 176 10*3/uL (ref 150–400)
RBC: 3.03 MIL/uL — ABNORMAL LOW (ref 3.87–5.11)
RDW: 12.3 % (ref 11.5–15.5)
WBC: 10.9 10*3/uL — AB (ref 4.0–10.5)

## 2014-12-29 LAB — URINE MICROSCOPIC-ADD ON

## 2014-12-29 MED ORDER — LACTATED RINGERS IV SOLN
500.0000 mL | Freq: Once | INTRAVENOUS | Status: AC
  Administered 2014-12-29: 500 mL via INTRAVENOUS

## 2014-12-29 MED ORDER — LACTATED RINGERS IV SOLN
Freq: Once | INTRAVENOUS | Status: AC
  Administered 2014-12-29: 10:00:00 via INTRAVENOUS

## 2014-12-29 MED ORDER — LACTATED RINGERS IV SOLN: 500.0000 mL | INTRAVENOUS | Status: DC | PRN

## 2014-12-29 MED ORDER — OXYCODONE-ACETAMINOPHEN 5-325 MG PO TABS: 2.0000 | ORAL_TABLET | ORAL | Status: DC | PRN

## 2014-12-29 MED ORDER — ONDANSETRON HCL 4 MG/2ML IJ SOLN
4.0000 mg | Freq: Four times a day (QID) | INTRAMUSCULAR | Status: DC | PRN
  Administered 2014-12-29: 4 mg via INTRAVENOUS
  Filled 2014-12-29: qty 2

## 2014-12-29 MED ORDER — OXYTOCIN BOLUS FROM INFUSION
500.0000 mL | INTRAVENOUS | Status: DC
  Administered 2014-12-29: 500 mL via INTRAVENOUS

## 2014-12-29 MED ORDER — PHENYLEPHRINE 40 MCG/ML (10ML) SYRINGE FOR IV PUSH (FOR BLOOD PRESSURE SUPPORT)
80.0000 ug | PREFILLED_SYRINGE | INTRAVENOUS | Status: DC | PRN
  Filled 2014-12-29: qty 2

## 2014-12-29 MED ORDER — OXYCODONE-ACETAMINOPHEN 5-325 MG PO TABS
1.0000 | ORAL_TABLET | ORAL | Status: DC | PRN
Start: 2014-12-29 — End: 2014-12-30

## 2014-12-29 MED ORDER — PHENYLEPHRINE 40 MCG/ML (10ML) SYRINGE FOR IV PUSH (FOR BLOOD PRESSURE SUPPORT)
80.0000 ug | PREFILLED_SYRINGE | INTRAVENOUS | Status: DC | PRN
  Filled 2014-12-29: qty 20
  Filled 2014-12-29: qty 2

## 2014-12-29 MED ORDER — ACETAMINOPHEN 325 MG PO TABS
650.0000 mg | ORAL_TABLET | ORAL | Status: DC | PRN
  Administered 2014-12-30: 650 mg via ORAL
  Filled 2014-12-29: qty 2

## 2014-12-29 MED ORDER — PENICILLIN G POTASSIUM 5000000 UNITS IJ SOLR
2.5000 10*6.[IU] | INTRAVENOUS | Status: DC
  Administered 2014-12-29: 2.5 10*6.[IU] via INTRAVENOUS
  Filled 2014-12-29 (×6): qty 2.5

## 2014-12-29 MED ORDER — FENTANYL 2.5 MCG/ML BUPIVACAINE 1/10 % EPIDURAL INFUSION (WH - ANES)
INTRAMUSCULAR | Status: DC | PRN
  Administered 2014-12-29: 12 mL/h via EPIDURAL

## 2014-12-29 MED ORDER — SODIUM CHLORIDE 0.9 % IJ SOLN
3.0000 mL | Freq: Two times a day (BID) | INTRAMUSCULAR | Status: DC
Start: 2014-12-29 — End: 2014-12-29

## 2014-12-29 MED ORDER — OXYTOCIN 40 UNITS IN LACTATED RINGERS INFUSION - SIMPLE MED
62.5000 mL/h | INTRAVENOUS | Status: DC
  Filled 2014-12-29: qty 1000

## 2014-12-29 MED ORDER — EPHEDRINE 5 MG/ML INJ
10.0000 mg | INTRAVENOUS | Status: DC | PRN
  Filled 2014-12-29: qty 2

## 2014-12-29 MED ORDER — DIPHENHYDRAMINE HCL 50 MG/ML IJ SOLN: 12.5000 mg | INTRAMUSCULAR | Status: DC | PRN

## 2014-12-29 MED ORDER — CITRIC ACID-SODIUM CITRATE 334-500 MG/5ML PO SOLN
30.0000 mL | ORAL | Status: DC | PRN
  Administered 2014-12-29: 30 mL via ORAL
  Filled 2014-12-29: qty 15

## 2014-12-29 MED ORDER — LACTATED RINGERS IV SOLN
INTRAVENOUS | Status: DC
  Administered 2014-12-29: 16:00:00 via INTRAVENOUS

## 2014-12-29 MED ORDER — SODIUM CHLORIDE 0.9 % IJ SOLN: 3.0000 mL | INTRAMUSCULAR | Status: DC | PRN

## 2014-12-29 MED ORDER — FENTANYL 2.5 MCG/ML BUPIVACAINE 1/10 % EPIDURAL INFUSION (WH - ANES)
14.0000 mL/h | INTRAMUSCULAR | Status: DC | PRN
  Administered 2014-12-29: 14 mL/h via EPIDURAL
  Filled 2014-12-29: qty 125

## 2014-12-29 MED ORDER — PENICILLIN G POTASSIUM 5000000 UNITS IJ SOLR
5.0000 10*6.[IU] | Freq: Once | INTRAVENOUS | Status: AC
  Administered 2014-12-29: 5 10*6.[IU] via INTRAVENOUS
  Filled 2014-12-29: qty 5

## 2014-12-29 MED ORDER — SODIUM CHLORIDE 0.9 % IV SOLN: 250.0000 mL | INTRAVENOUS | Status: DC | PRN

## 2014-12-29 MED ORDER — LIDOCAINE-EPINEPHRINE (PF) 1.5 %-1:200000 IJ SOLN
INTRAMUSCULAR | Status: DC | PRN
  Administered 2014-12-29: 3 mL

## 2014-12-29 MED ORDER — LIDOCAINE HCL (PF) 1 % IJ SOLN
30.0000 mL | INTRAMUSCULAR | Status: DC | PRN
  Filled 2014-12-29: qty 30

## 2014-12-29 MED ORDER — BUPIVACAINE HCL (PF) 0.25 % IJ SOLN
INTRAMUSCULAR | Status: DC | PRN
  Administered 2014-12-29 (×2): 4 mL via EPIDURAL

## 2014-12-29 MED ORDER — BUTORPHANOL TARTRATE 1 MG/ML IJ SOLN: 1.0000 mg | INTRAMUSCULAR | Status: DC | PRN

## 2014-12-29 MED ORDER — FLEET ENEMA 7-19 GM/118ML RE ENEM: 1.0000 | ENEMA | RECTAL | Status: DC | PRN

## 2014-12-29 MED ORDER — NITROFURANTOIN MONOHYD MACRO 100 MG PO CAPS
100.0000 mg | ORAL_CAPSULE | Freq: Once | ORAL | Status: AC
  Administered 2014-12-29: 100 mg via ORAL
  Filled 2014-12-29: qty 1

## 2014-12-29 MED ORDER — NIFEDIPINE 10 MG PO CAPS
10.0000 mg | ORAL_CAPSULE | Freq: Three times a day (TID) | ORAL | Status: DC
  Administered 2014-12-29: 10 mg via ORAL
  Filled 2014-12-29 (×3): qty 1

## 2014-12-29 NOTE — H&P (Signed)
24 y.o. 4819w2d  G1P0 comes in c/o contractions and pain.  Otherwise has good fetal movement and no bleeding.  Pt had frequent ctxes initially without cervical change and was admitted for obs.  When she changed her cervix to 4 cm, she was fully admitted.  She had [facility].  GBS had not been done in office yet.  Past Medical History  Diagnosis Date  . Sinus infection   . UTI (lower urinary tract infection)   . Pyelonephritis   . Chlamydia     Past Surgical History  Procedure Laterality Date  . No past surgeries      OB History  Gravida Para Term Preterm AB SAB TAB Ectopic Multiple Living  1             # Outcome Date GA Lbr Len/2nd Weight Sex Delivery Anes PTL Lv  1 Current               History   Social History  . Marital Status: Single    Spouse Name: N/A  . Number of Children: N/A  . Years of Education: N/A   Occupational History  . Not on file.   Social History Main Topics  . Smoking status: Never Smoker   . Smokeless tobacco: Not on file  . Alcohol Use: No  . Drug Use: No  . Sexual Activity: Yes   Other Topics Concern  . Not on file   Social History Narrative   Review of patient's allergies indicates no known allergies.    Prenatal Transfer Tool  Maternal Diabetes: No Genetic Screening: Normal Maternal Ultrasounds/Referrals: Normal Fetal Ultrasounds or other Referrals:  None Maternal Substance Abuse:  No Significant Maternal Medications:  None Significant Maternal Lab Results: None  Other PNC: uncomplicated.    Filed Vitals:   12/29/14 1723  BP: 118/68  Pulse: 92  Temp: 98.8 F (37.1 C)  Resp: 18     Lungs/Cor:  NAD Abdomen:  soft, gravid Ex:  no cords, erythema SVE:  6/C/-1, AROM FHTs:  140s, good STV, NST R Toco:  q3-5   A/P   Preterm labor at 5819w2d - admitted for labor.  GBS unknown- PCN given.  Hannah Fuller A

## 2014-12-29 NOTE — Anesthesia Procedure Notes (Signed)
Epidural Patient location during procedure: OB  Staffing Anesthesiologist: Tzipporah Nagorski, CHRIS Performed by: anesthesiologist   Preanesthetic Checklist Completed: patient identified, surgical consent, pre-op evaluation, timeout performed, IV checked, risks and benefits discussed and monitors and equipment checked  Epidural Patient position: sitting Prep: site prepped and draped and DuraPrep Patient monitoring: heart rate, cardiac monitor, continuous pulse ox and blood pressure Approach: midline Location: L4-L5 Injection technique: LOR saline  Needle:  Needle type: Tuohy  Needle gauge: 17 G Needle length: 9 cm Needle insertion depth: 5 cm Catheter type: closed end flexible Catheter size: 19 Gauge Catheter at skin depth: 12 cm Test dose: negative and 1.5% lidocaine with Epi 1:200 K  Assessment Events: blood not aspirated, injection not painful, no injection resistance, negative IV test and no paresthesia  Additional Notes H+P and labs checked, risks and benefits discussed with the patient, consent obtained, procedure tolerated well and without complications.  Reason for block:procedure for pain   

## 2014-12-29 NOTE — Anesthesia Preprocedure Evaluation (Signed)
Anesthesia Evaluation  Patient identified by MRN, date of birth, ID band Patient awake    Reviewed: Allergy & Precautions, NPO status , Patient's Chart, lab work & pertinent test results  History of Anesthesia Complications Negative for: history of anesthetic complications  Airway Mallampati: I  TM Distance: >3 FB Neck ROM: Full    Dental  (+) Teeth Intact   Pulmonary neg pulmonary ROS,          Cardiovascular negative cardio ROS  Rhythm:Regular     Neuro/Psych negative neurological ROS  negative psych ROS   GI/Hepatic Neg liver ROS, GERD-  Medicated and Controlled,  Endo/Other  negative endocrine ROS  Renal/GU negative Renal ROS     Musculoskeletal   Abdominal   Peds  Hematology  (+) anemia ,   Anesthesia Other Findings   Reproductive/Obstetrics (+) Pregnancy                             Anesthesia Physical Anesthesia Plan  ASA: II  Anesthesia Plan: Epidural   Post-op Pain Management:    Induction:   Airway Management Planned:   Additional Equipment:   Intra-op Plan:   Post-operative Plan:   Informed Consent: I have reviewed the patients History and Physical, chart, labs and discussed the procedure including the risks, benefits and alternatives for the proposed anesthesia with the patient or authorized representative who has indicated his/her understanding and acceptance.     Plan Discussed with: Anesthesiologist  Anesthesia Plan Comments:         Anesthesia Quick Evaluation

## 2014-12-30 ENCOUNTER — Encounter (HOSPITAL_COMMUNITY): Payer: Self-pay | Admitting: *Deleted

## 2014-12-30 LAB — CBC
HEMATOCRIT: 28.2 % — AB (ref 36.0–46.0)
HEMOGLOBIN: 9.6 g/dL — AB (ref 12.0–15.0)
MCH: 33.2 pg (ref 26.0–34.0)
MCHC: 34 g/dL (ref 30.0–36.0)
MCV: 97.6 fL (ref 78.0–100.0)
Platelets: 154 10*3/uL (ref 150–400)
RBC: 2.89 MIL/uL — AB (ref 3.87–5.11)
RDW: 12.2 % (ref 11.5–15.5)
WBC: 15 10*3/uL — ABNORMAL HIGH (ref 4.0–10.5)

## 2014-12-30 LAB — RPR: RPR: NONREACTIVE

## 2014-12-30 MED ORDER — SODIUM CHLORIDE 0.9 % IJ SOLN: 3.0000 mL | Freq: Two times a day (BID) | INTRAMUSCULAR | Status: DC

## 2014-12-30 MED ORDER — MEASLES, MUMPS & RUBELLA VAC ~~LOC~~ INJ
0.5000 mL | INJECTION | Freq: Once | SUBCUTANEOUS | Status: DC
  Filled 2014-12-30: qty 0.5

## 2014-12-30 MED ORDER — OXYCODONE-ACETAMINOPHEN 5-325 MG PO TABS: 2.0000 | ORAL_TABLET | ORAL | Status: DC | PRN

## 2014-12-30 MED ORDER — ONDANSETRON HCL 4 MG PO TABS
4.0000 mg | ORAL_TABLET | ORAL | Status: DC | PRN
  Filled 2014-12-30: qty 1

## 2014-12-30 MED ORDER — PRENATAL MULTIVITAMIN CH
1.0000 | ORAL_TABLET | Freq: Every day | ORAL | Status: DC
  Administered 2014-12-30 – 2014-12-31 (×3): 1 via ORAL
  Filled 2014-12-30 (×2): qty 1

## 2014-12-30 MED ORDER — DIPHENHYDRAMINE HCL 25 MG PO CAPS: 25.0000 mg | ORAL_CAPSULE | Freq: Four times a day (QID) | ORAL | Status: DC | PRN

## 2014-12-30 MED ORDER — DIBUCAINE 1 % RE OINT: 1.0000 "application " | TOPICAL_OINTMENT | RECTAL | Status: DC | PRN

## 2014-12-30 MED ORDER — MAGNESIUM HYDROXIDE 400 MG/5ML PO SUSP: 30.0000 mL | ORAL | Status: DC | PRN

## 2014-12-30 MED ORDER — FAMOTIDINE 20 MG PO TABS
20.0000 mg | ORAL_TABLET | Freq: Every day | ORAL | Status: DC
  Administered 2014-12-30 – 2014-12-31 (×2): 20 mg via ORAL
  Filled 2014-12-30 (×2): qty 1

## 2014-12-30 MED ORDER — ONDANSETRON HCL 4 MG/2ML IJ SOLN: 4.0000 mg | INTRAMUSCULAR | Status: DC | PRN

## 2014-12-30 MED ORDER — METHYLERGONOVINE MALEATE 0.2 MG PO TABS
0.2000 mg | ORAL_TABLET | ORAL | Status: DC | PRN
Start: 2014-12-30 — End: 2015-01-01

## 2014-12-30 MED ORDER — METHYLERGONOVINE MALEATE 0.2 MG/ML IJ SOLN: 0.2000 mg | INTRAMUSCULAR | Status: DC | PRN

## 2014-12-30 MED ORDER — OXYCODONE-ACETAMINOPHEN 5-325 MG PO TABS
1.0000 | ORAL_TABLET | ORAL | Status: DC | PRN
  Administered 2014-12-30 – 2014-12-31 (×2): 1 via ORAL
  Filled 2014-12-30 (×3): qty 1

## 2014-12-30 MED ORDER — SODIUM CHLORIDE 0.9 % IV SOLN: 250.0000 mL | INTRAVENOUS | Status: DC | PRN

## 2014-12-30 MED ORDER — PROMETHAZINE HCL 25 MG PO TABS
25.0000 mg | ORAL_TABLET | ORAL | Status: DC | PRN
  Administered 2014-12-30 – 2014-12-31 (×3): 25 mg via ORAL
  Filled 2014-12-30 (×4): qty 1

## 2014-12-30 MED ORDER — LANOLIN HYDROUS EX OINT: TOPICAL_OINTMENT | CUTANEOUS | Status: DC | PRN

## 2014-12-30 MED ORDER — FERROUS SULFATE 325 (65 FE) MG PO TABS
325.0000 mg | ORAL_TABLET | Freq: Two times a day (BID) | ORAL | Status: DC
  Administered 2014-12-31 – 2015-01-01 (×2): 325 mg via ORAL
  Filled 2014-12-30 (×4): qty 1

## 2014-12-30 MED ORDER — WITCH HAZEL-GLYCERIN EX PADS: 1.0000 "application " | MEDICATED_PAD | CUTANEOUS | Status: DC | PRN

## 2014-12-30 MED ORDER — ZOLPIDEM TARTRATE 5 MG PO TABS: 5.0000 mg | ORAL_TABLET | Freq: Every evening | ORAL | Status: DC | PRN

## 2014-12-30 MED ORDER — TETANUS-DIPHTH-ACELL PERTUSSIS 5-2.5-18.5 LF-MCG/0.5 IM SUSP: 0.5000 mL | Freq: Once | INTRAMUSCULAR | Status: DC

## 2014-12-30 MED ORDER — SODIUM CHLORIDE 0.9 % IJ SOLN: 3.0000 mL | INTRAMUSCULAR | Status: DC | PRN

## 2014-12-30 MED ORDER — BENZOCAINE-MENTHOL 20-0.5 % EX AERO
1.0000 "application " | INHALATION_SPRAY | CUTANEOUS | Status: DC | PRN
  Administered 2014-12-30: 1 via TOPICAL
  Filled 2014-12-30: qty 56

## 2014-12-30 MED ORDER — SENNOSIDES-DOCUSATE SODIUM 8.6-50 MG PO TABS
2.0000 | ORAL_TABLET | ORAL | Status: DC
  Administered 2014-12-30 – 2014-12-31 (×2): 2 via ORAL
  Filled 2014-12-30: qty 2

## 2014-12-30 MED ORDER — SIMETHICONE 80 MG PO CHEW: 80.0000 mg | CHEWABLE_TABLET | ORAL | Status: DC | PRN

## 2014-12-30 MED ORDER — IBUPROFEN 800 MG PO TABS
800.0000 mg | ORAL_TABLET | Freq: Three times a day (TID) | ORAL | Status: DC
  Administered 2014-12-30 – 2015-01-01 (×7): 800 mg via ORAL
  Filled 2014-12-30 (×7): qty 1

## 2014-12-30 NOTE — Progress Notes (Signed)
Patient is eating, ambulating, voiding.  Pain control is good.  Filed Vitals:   12/30/14 0100 12/30/14 0200 12/30/14 0300 12/30/14 0720  BP: 100/69 104/61 106/65 96/57  Pulse: 94 83 88 87  Temp:  99.4 F (37.4 C) 99.4 F (37.4 C) 99.1 F (37.3 C)  TempSrc:  Oral Oral Oral  Resp:  20 20 20   Height:      Weight:      SpO2:        Fundus firm Perineum without swelling.  Lab Results  Component Value Date   WBC 15.0* 12/30/2014   HGB 9.6* 12/30/2014   HCT 28.2* 12/30/2014   MCV 97.6 12/30/2014   PLT 154 12/30/2014    /RI/B+  A/P Post partum day 1.  Routine care.  Expect d/c routine.    Alvaretta Eisenberger A

## 2014-12-30 NOTE — Anesthesia Postprocedure Evaluation (Signed)
Anesthesia Post Note  Patient: Hannah Fuller  Procedure(s) Performed: * No procedures listed *  Anesthesia type: Epidural  Patient location: Mother/Baby  Post pain: Pain level controlled  Post assessment: Post-op Vital signs reviewed  Last Vitals:  Filed Vitals:   12/30/14 0720  BP: 96/57  Pulse: 87  Temp: 37.3 C  Resp: 20    Post vital signs: Reviewed  Level of consciousness:alert  Complications: No apparent anesthesia complications

## 2014-12-30 NOTE — Lactation Note (Addendum)
This note was copied from the chart of Girl Bernita RaisinChontia Reichel. Lactation Consultation Note  Patient Name: Girl Bernita RaisinChontia Axon ZOXWR'UToday's Date: 12/30/2014 Reason for consult: Initial assessment LPI, 16 hours of life. Mom reports that baby has been nursing well and she is seeing colostrum at breast. Assisted mom to latch baby to left breast in football position. Baby sleepy at breast and not interested in latching. Discussed LPI behavior and gave LPI sheet with LPI education and recommendations for care. Assisted mom to hand express colostrum and demonstrated to parents how to feed baby with spoon. Baby took 3 mls of colostrum and tolerated feeding well. Assisted mom to begin pumping. Plan is for mom to put baby to breast with cues, and at least every 3 hours. Then to supplement baby with EBM/formula according to LPI supplementation guidelines. After baby is fed, enc mom to post-pump and hand express, keeping EBM at bedside for next feeding. Discussed limiting total feeding time to 30 minutes, and being careful not to overstimulate baby. Discussed use of curve-tipped syringe at breast or with finger-feeding as supplementation volumes increase. Mom states that she understands and agrees with feeding plan. Discussed assessment, interventions, and plan with patient's RN, Clydie BraunKaren.  Maternal Data Has patient been taught Hand Expression?: Yes Does the patient have breastfeeding experience prior to this delivery?: No  Feeding Feeding Type: Breast Fed Length of feed:  (attempt slept)  LATCH Score/Interventions Latch: Too sleepy or reluctant, no latch achieved, no sucking elicited. Intervention(s): Skin to skin;Teach feeding cues;Waking techniques Intervention(s): Adjust position;Assist with latch;Breast compression;Breast massage  Audible Swallowing: None Intervention(s): Skin to skin  Type of Nipple: Everted at rest and after stimulation  Comfort (Breast/Nipple): Soft / non-tender     Hold (Positioning):  Assistance needed to correctly position infant at breast and maintain latch. Intervention(s): Breastfeeding basics reviewed;Support Pillows;Position options;Skin to skin  LATCH Score: 5  Lactation Tools Discussed/Used Tools: Pump Breast pump type: Double-Electric Breast Pump   Consult Status Consult Status: Follow-up Date: 12/31/14 Follow-up type: In-patient    Geralynn OchsWILLIARD, Jahmere Bramel 12/30/2014, 5:31 PM

## 2014-12-31 LAB — CULTURE, OB URINE: Colony Count: 85000

## 2014-12-31 MED ORDER — IBUPROFEN 800 MG PO TABS: 800.0000 mg | ORAL_TABLET | Freq: Three times a day (TID) | ORAL | Status: DC | PRN

## 2014-12-31 MED ORDER — OXYCODONE-ACETAMINOPHEN 5-325 MG PO TABS: 1.0000 | ORAL_TABLET | ORAL | Status: DC | PRN

## 2014-12-31 NOTE — Discharge Summary (Signed)
Obstetric Discharge Summary Reason for Admission: onset of labor Prenatal Procedures: NST and ultrasound Intrapartum Procedures: spontaneous vaginal delivery Postpartum Procedures: none Complications-Operative and Postpartum: none HEMOGLOBIN  Date Value Ref Range Status  12/30/2014 9.6* 12.0 - 15.0 g/dL Final   HCT  Date Value Ref Range Status  12/30/2014 28.2* 36.0 - 46.0 % Final    Physical Exam:  General: alert, cooperative and no distress Lochia: appropriate Uterine Fundus: firm DVT Evaluation: No evidence of DVT seen on physical exam. Negative Homan's sign. No cords or calf tenderness.  Discharge Diagnoses: Pre-term delivery  Discharge Information: Date: 12/31/2014 Activity: pelvic rest Diet: routine Medications: Ibuprofen and Percocet Condition: stable Instructions: refer to practice specific booklet Discharge to: home   Newborn Data: Live born female  Birth Weight: 5 lb 2.9 oz (2350 g) APGAR: 8, 9 Baby admitted for extra stay d/t jaundice  Essie HartINN, Latunya Kissick STACIA 12/31/2014, 7:38 PM

## 2014-12-31 NOTE — Lactation Note (Signed)
This note was copied from the chart of Hannah Fuller Capp. Lactation Consultation Note: Mom is hand expressing when I went into room. Mom reports this is the most she has gotten. Baby latched well but was sleepy. Foley cup given to mom with instructions gave about 2 cc EBM then baby more alert and latched again. [facility] Complaining of cramping while nursing No questions at present.   Patient Name: Hannah Fuller Sisemore ZOXWR'UToday's Date: 12/31/2014 Reason for consult: Follow-up assessment;Late preterm infant;Infant < 6lbs   Maternal Data    Feeding Feeding Type: Breast Fed Length of feed: 10 min  LATCH Score/Interventions Latch: Grasps breast easily, tongue down, lips flanged, rhythmical sucking.  Audible Swallowing: A few with stimulation  Type of Nipple: Everted at rest and after stimulation  Comfort (Breast/Nipple): Soft / non-tender     Hold (Positioning): No assistance needed to correctly position infant at breast.  LATCH Score: 9  Lactation Tools Discussed/Used Tools: Feeding cup Breast pump type: Double-Electric Breast Pump   Consult Status Consult Status: Follow-up Date: 01/01/15 Follow-up type: In-patient    Pamelia HoitWeeks, Jadae Steinke D 12/31/2014, 3:31 PM

## 2014-12-31 NOTE — Progress Notes (Signed)
UR chart review completed.  

## 2014-12-31 NOTE — Progress Notes (Signed)
PT REFUSED ZOFRAN WHEN OFFERED AGAIN.

## 2014-12-31 NOTE — Progress Notes (Signed)
RN in room due to pt c/o nauseated. Upon arrival in room pt. Requesting phenergan by name for nausea. Pt stated they gave me some yesterday when this happened to me. Pt no vomiting, retching, dry- heaving. Pt watching TV  holding infant. EXPLAINED TO PT NO ORDER FOR PHENERGAN AND TO LETS TRY GINGER ALE AND CRACKERS FIRST DUE TO NOT EATING SINCE 2100 PM EARLIER. PT REFUSED GINGER-ALE, STATED - THAT 'S NOT GOING TO WORK.  PT. REFUSED CRACKERS AT PRESENT.

## 2014-12-31 NOTE — Progress Notes (Signed)
RN BACK IN ROOM WITH ZOFRAN THAT WAS ORDERED FOR NAUSEA EARLIER. PT STATED" I DON'T WANT THAT, IT WILL NOT  WORK"   PT STATED - "  I WAS TAKING PHENERGAN BEFORE I DELIVERED, ZOFRAN DOES NOT DO ANYTHING" PT STATED  "JUST GIVE ME SOME CRACKERS , HE (POINTING TO FOB IS SLEEPING NOW, I GOT TO STAY AWAKE NOW." SALTINE CRACKERS GIVEN TO PT.

## 2014-12-31 NOTE — Progress Notes (Signed)
Post Partum Day 2 Subjective: up ad lib, voiding, tolerating PO, + flatus and breast feeding  Objective: Blood pressure 116/84, pulse 81, temperature 98.4 F (36.9 C), temperature source Oral, resp. rate 18, height 5\' 4"  (1.626 m), weight 58.06 kg (128 lb), last menstrual period 04/25/2014, SpO2 99 %, unknown if currently breastfeeding.  Physical Exam:  General: alert, cooperative and no distress Lochia: appropriate Uterine Fundus: firm DVT Evaluation: No evidence of DVT seen on physical exam. Negative Homan's sign. No cords or calf tenderness.   Recent Labs  12/29/14 1635 12/30/14 0539  HGB 9.9* 9.6*  HCT 29.8* 28.2*    Assessment/Plan: Baby is not going home today as she has jaundice so mother is distressed.   Will post pone discharge to am, but will write order and get paperwork together.    LOS: 2 days   Renly Roots STACIA 12/31/2014, 7:30 PM

## 2015-01-01 ENCOUNTER — Ambulatory Visit: Payer: Self-pay

## 2015-01-01 NOTE — Lactation Note (Signed)
This note was copied from the chart of Hannah Fuller Olarte. Lactation Consultation Note Baby had 9%weight loss. Baby LPI. Room temp. 78 degrees. Baby looked flushed with t-shirt, sleeper, blanket and hat on. Temp. Said 98.8. Mom said the baby's temp had went down that's why they had all the cover and room so hot. I did notice that the baby of course for age, could be difficulty to get accurate temp. D/t no brown fat on body and any gap on thermometers and skin could cause to be low when it isn't.  Moms breast are full, tight, shiney with some knots. She states they hurt. Explained engorgement. ICE applied. Hand expression of 25ml.  Baby sleepy, used stimulation to wake up. W/gloved finger assessed suckle. Noted upper lip frenulum and lower tight tongue w/limited movement. Easily visible close to end. I feel some of the problem is milk transfer d/t mom states the baby has been BF and still acting like shes not satisfied until she has started giving formula at intervals d/t weight loss. Mom prefers to just BF. Mom has bruised tender short shaft nipples. Comfort gels given. Fitted w/#16 and #20 NS. #16 fits well. Latched baby on and w/much stimulation and syring fed colostrum in NS, baby started sucking. Breast softened. Breast massage demonstrated. Shells given to mom to wear between feedings, but not when engorged.  Discussed BF plan w/mom and knows to use NS. [facility].  Patient Name: Hannah Fuller Minckler ZOXWR'UToday's Date: 01/01/2015 Reason for consult: Follow-up assessment;Infant weight loss   Maternal Data    Feeding Feeding Type: Breast Milk Length of feed: 15 min  LATCH Score/Interventions Latch: Grasps breast easily, tongue down, lips flanged, rhythmical sucking. Intervention(s): Teach feeding cues;Waking techniques Intervention(s): Adjust position;Assist with latch;Breast compression;Breast massage  Audible Swallowing: Spontaneous and  intermittent Intervention(s): Hand expression Intervention(s): Alternate breast massage;Hand expression  Type of Nipple: Everted at rest and after stimulation (short shaft) Intervention(s): Shells;Double electric pump  Comfort (Breast/Nipple): Engorged, cracked, bleeding, large blisters, severe discomfort Problem noted: Engorgment Intervention(s): Ice;Hand expression  Problem noted: Mild/Moderate discomfort Interventions (Filling): Double electric pump;Frequent nursing;Massage;Firm support Interventions (Mild/moderate discomfort): Hand massage;Hand expression;Post-pump;Comfort gels;Breast shields  Hold (Positioning): Assistance needed to correctly position infant at breast and maintain latch. Intervention(s): Position options;Support Pillows;Breastfeeding basics reviewed  LATCH Score: 7  Lactation Tools Discussed/Used Tools: Nipple Shields Nipple shield size: 20 Shell Type: Inverted Breast pump type: Double-Electric Breast Pump   Consult Status Consult Status: Follow-up Date: 01/02/15 Follow-up type: In-patient    Charyl DancerCARVER, Keilany Burnette G 01/01/2015, 4:41 PM

## 2015-01-02 ENCOUNTER — Ambulatory Visit: Payer: Self-pay

## 2015-01-02 NOTE — Lactation Note (Signed)
This note was copied from the chart of [name]. Lactation Consultation Note  Patient Name: [name] ZOXWR'UToday's Date: 01/02/2015 Reason for consult: Follow-up assessment (engorgement , Right > left )  LC assessed breast tissue with moms permission ,  Right  Breast still firm lateral  Aspect , and left breast softened and still some firm nodules but not as much as left breast. LC recommended icing for 15  More minutes , and then pump both breast for 15 -20 mins. LC fixed mom a 3rd ice pack for the posterior areas of the breast an and the lateral aspects. Pump set up for mom. LC also recommended for dad to obtain Motrin prescription. Motrin can help decrease engorgement due to be an  anti -inflammatory. LC will re- visit mom.    Maternal Data    Feeding Feeding Type:  (baby last fed ay 0930 per mom form a bottle ) Length of feed: 20 min  LATCH Score/Interventions Latch: Grasps breast easily, tongue down, lips flanged, rhythmical sucking.  Audible Swallowing: Spontaneous and intermittent  Type of Nipple: Everted at rest and after stimulation  Comfort (Breast/Nipple): Filling, red/small blisters or bruises, mild/mod discomfort Problem noted: Engorgment Intervention(s): Ice Intervention(s): Double electric pump;Expressed breast milk to nipple  Problem noted: Mild/Moderate discomfort;Filling Interventions (Filling): Massage  Hold (Positioning): Assistance needed to correctly position infant at breast and maintain latch. Intervention(s): Breastfeeding basics reviewed  LATCH Score: 8  Lactation Tools Discussed/Used     Consult Status Consult Status: Follow-up Date: 01/02/15 Follow-up type: In-patient    Hannah Fuller, Hannah Fuller 01/02/2015, 10:28 AM

## 2015-01-02 NOTE — Lactation Note (Signed)
This note was copied from the chart of Girl Bernita RaisinChontia Eddie. Lactation Consultation Note  Patient Name: Girl Bernita RaisinChontia Gero WUJWJ'XToday's Date: 01/02/2015 Reason for consult: Follow-up assessment (re-latched )  Mom has had issues with engorgement , has iced , pumped for 15 -20 mins with dad and LC Assisting with massage , and mom pumped for right 70 ml , and left 50 ml . And per mom feels great relief. Baby woke up and LC assisted mom with latch same breast . Baby latched with depth 10 mins and released and then Acted hungry still , re-latched with multiply swallows , increased with breast compressions. Baby still feeding 2nd latch. Per mom the baby doesn't like the nipple. Baby latched well without the NS. Discussed with mom potential feeding behaviors with late pre-term infant less than 5 pounds, and the importance of  The baby feeding every 3 hours , if the baby won't latch to feed form a bottle , try an appetizer 1st , and then if still not latching, Give the feeding as a bottle and pump. LC recommended feeding on 1 breast and supplement after following the guidelines and then pumping other breast. Sore nipple and engorgement prevention and tx reviewed . Referring to the baby and me booklet pages 24 -25.  Mom already has comfort gels. LC feels mom has a good understanding of the importance of preventing engorgement and feeding every 3 hours or earlier. LC stressed the importance of calling LC office with BF questions. LC called WIC Supervisor to move her pat up to today , call pending . Also faxed WIC form with moms information. [facility] pm , apt reminder given to mom .      Maternal Data    Feeding Feeding Type: Breast Fed Length of feed: 10 min (multiply swallows , increased with breast compressions )  LATCH Score/Interventions Latch: Grasps breast easily, tongue down, lips flanged, rhythmical sucking. Intervention(s): Skin to skin;Teach feeding cues;Waking  techniques Intervention(s): Adjust position;Assist with latch;Breast massage;Breast compression  Audible Swallowing: Spontaneous and intermittent  Type of Nipple: Everted at rest and after stimulation  Comfort (Breast/Nipple): Filling, red/small blisters or bruises, mild/mod discomfort Problem noted: Engorgment Intervention(s): Ice     Hold (Positioning): Assistance needed to correctly position infant at breast and maintain latch. Intervention(s): Breastfeeding basics reviewed;Support Pillows;Position options;Skin to skin  LATCH Score: 8  Lactation Tools Discussed/Used WIC Program: Yes (LC called WIC , see LC note )   Consult Status Consult Status: Follow-up Date: 01/08/15 (at 1 pm ) Follow-up type: Out-patient    Kathrin Greathouseorio, Emmanuelle Coxe Ann 01/02/2015, 12:21 PM

## 2015-01-02 NOTE — Lactation Note (Signed)
This note was copied from the chart of Hannah Bernita RaisinChontia Derringer. Lactation Consultation Note  Patient Name: Hannah Fuller ZOXWR'UToday's Date: 01/02/2015 Reason for consult: Follow-up assessment (engorgement , Right > left )  LC into check mom, mom eating her breakfast and icing. LC asked mom to call  LC when she is ready to pump after she eats.    Maternal Data    Feeding Feeding Type:  (baby last fed ay 0930 per mom form a bottle ) Length of feed: 20 min  LATCH Score/Interventions Latch: Grasps breast easily, tongue down, lips flanged, rhythmical sucking.  Audible Swallowing: Spontaneous and intermittent  Type of Nipple: Everted at rest and after stimulation  Comfort (Breast/Nipple): Filling, red/small blisters or bruises, mild/mod discomfort Problem noted: Engorgment Intervention(s): Ice Intervention(s): Double electric pump;Expressed breast milk to nipple  Problem noted: Mild/Moderate discomfort;Filling Interventions (Filling): Massage  Hold (Positioning): Assistance needed to correctly position infant at breast and maintain latch. Intervention(s): Breastfeeding basics reviewed  LATCH Score: 8  Lactation Tools Discussed/Used     Consult Status Consult Status: Follow-up Date: 01/02/15 Follow-up type: In-patient    Kathrin Greathouseorio, Khiry Pasquariello Ann 01/02/2015, 10:50 AM

## 2015-01-08 ENCOUNTER — Ambulatory Visit (HOSPITAL_COMMUNITY): Payer: Medicaid Other

## 2015-10-22 ENCOUNTER — Encounter (HOSPITAL_BASED_OUTPATIENT_CLINIC_OR_DEPARTMENT_OTHER): Payer: Self-pay | Admitting: Emergency Medicine

## 2015-10-22 ENCOUNTER — Emergency Department (HOSPITAL_BASED_OUTPATIENT_CLINIC_OR_DEPARTMENT_OTHER)
Admission: EM | Admit: 2015-10-22 | Discharge: 2015-10-22 | Disposition: A | Discharge status: Home / Self Care | Payer: Medicaid Other | Attending: Emergency Medicine | Admitting: Emergency Medicine

## 2015-10-22 DIAGNOSIS — Z3202 Encounter for pregnancy test, result negative: Secondary | ICD-10-CM | POA: Diagnosis not present

## 2015-10-22 DIAGNOSIS — R112 Nausea with vomiting, unspecified: Secondary | ICD-10-CM | POA: Diagnosis not present

## 2015-10-22 DIAGNOSIS — Z79899 Other long term (current) drug therapy: Secondary | ICD-10-CM | POA: Diagnosis not present

## 2015-10-22 DIAGNOSIS — Z87440 Personal history of urinary (tract) infections: Secondary | ICD-10-CM | POA: Diagnosis not present

## 2015-10-22 DIAGNOSIS — R1110 Vomiting, unspecified: Secondary | ICD-10-CM | POA: Diagnosis present

## 2015-10-22 DIAGNOSIS — Z8742 Personal history of other diseases of the female genital tract: Secondary | ICD-10-CM | POA: Insufficient documentation

## 2015-10-22 DIAGNOSIS — R197 Diarrhea, unspecified: Secondary | ICD-10-CM | POA: Insufficient documentation

## 2015-10-22 DIAGNOSIS — Z8709 Personal history of other diseases of the respiratory system: Secondary | ICD-10-CM | POA: Diagnosis not present

## 2015-10-22 DIAGNOSIS — Z8619 Personal history of other infectious and parasitic diseases: Secondary | ICD-10-CM | POA: Diagnosis not present

## 2015-10-22 LAB — URINALYSIS, ROUTINE W REFLEX MICROSCOPIC
Glucose, UA: NEGATIVE mg/dL
Hgb urine dipstick: NEGATIVE
Ketones, ur: 15 mg/dL — AB
LEUKOCYTES UA: NEGATIVE
NITRITE: NEGATIVE
PH: 5.5 (ref 5.0–8.0)
Protein, ur: NEGATIVE mg/dL
SPECIFIC GRAVITY, URINE: 1.031 — AB (ref 1.005–1.030)

## 2015-10-22 LAB — BASIC METABOLIC PANEL
Anion gap: 7 (ref 5–15)
BUN: 12 mg/dL (ref 6–20)
CALCIUM: 8.7 mg/dL — AB (ref 8.9–10.3)
CO2: 24 mmol/L (ref 22–32)
CREATININE: 0.72 mg/dL (ref 0.44–1.00)
Chloride: 108 mmol/L (ref 101–111)
GFR calc Af Amer: >60 mL/min (ref >60)
GFR calc non Af Amer: >60 mL/min (ref >60)
GLUCOSE: 102 mg/dL — AB (ref 65–99)
Potassium: 3.8 mmol/L (ref 3.5–5.1)
Sodium: 139 mmol/L (ref 135–145)

## 2015-10-22 LAB — RAPID URINE DRUG SCREEN, HOSP PERFORMED
Amphetamines: NOT DETECTED
BENZODIAZEPINES: NOT DETECTED
Barbiturates: NOT DETECTED
COCAINE: NOT DETECTED
Opiates: NOT DETECTED
TETRAHYDROCANNABINOL: NOT DETECTED

## 2015-10-22 LAB — PREGNANCY, URINE: PREG TEST UR: NEGATIVE

## 2015-10-22 MED ORDER — SODIUM CHLORIDE 0.9 % IV BOLUS (SEPSIS)
500.0000 mL | Freq: Once | INTRAVENOUS | Status: AC
  Administered 2015-10-22: 500 mL via INTRAVENOUS

## 2015-10-22 MED ORDER — PROMETHAZINE HCL 12.5 MG RE SUPP: 25.0000 mg | Freq: Four times a day (QID) | RECTAL | Status: DC | PRN

## 2015-10-22 MED ORDER — PROMETHAZINE HCL 25 MG/ML IJ SOLN
12.5000 mg | Freq: Once | INTRAMUSCULAR | Status: AC
  Administered 2015-10-22: 12.5 mg via INTRAVENOUS
  Filled 2015-10-22: qty 1

## 2015-10-22 NOTE — ED Provider Notes (Signed)
CSN: [id number]     Arrival date & time 10/22/15  0151 History   First MD Initiated Contact with Patient 10/22/15 0405     Chief Complaint  Patient presents with  . Emesis     (Consider location/radiation/quality/duration/timing/severity/associated sxs/prior Treatment) Patient is a 24 y.o. female presenting with vomiting. The history is provided by the patient.  Emesis Severity:  Moderate Duration:  1 day Timing:  Intermittent Quality:  Stomach contents Progression:  Unchanged Chronicity:  New Recent urination:  Normal Relieved by:  Nothing Worsened by:  Nothing tried Ineffective treatments:  None tried Associated symptoms: diarrhea   Risk factors: no suspect food intake and no travel to endemic areas     Past Medical History  Diagnosis Date  . Sinus infection   . UTI (lower urinary tract infection)   . Pyelonephritis   . Chlamydia    Past Surgical History  Procedure Laterality Date  . No past surgeries     History reviewed. No pertinent family history. Social History  Substance Use Topics  . Smoking status: Never Smoker   . Smokeless tobacco: None  . Alcohol Use: No   OB History    Gravida Para Term Preterm AB TAB SAB Ectopic Multiple Living   0 1     Review of Systems  Respiratory: Negative for cough.   Cardiovascular: Negative for chest pain.  Gastrointestinal: Positive for vomiting and diarrhea.  All other systems reviewed and are negative.     Allergies  Zofran  Home Medications   Prior to Admission medications   Medication Sig Start Date End Date Taking? Authorizing Provider  FAMOTIDINE PO Take 1 tablet by mouth daily. Pt is unsure of dose.    Historical Provider, MD  ibuprofen (ADVIL,MOTRIN) 800 MG tablet Take 1 tablet (800 mg total) by mouth every 8 (eight) hours as needed. 12/31/14   Essie Hart, MD  oxyCODONE-acetaminophen (PERCOCET/ROXICET) 5-325 MG per tablet Take 1-2 tablets by mouth every 4 (four) hours as needed (for pain  scale less than 7). 12/31/14   Essie Hart, MD  Prenatal Vit-Fe Fumarate-FA (PRENATAL MULTIVITAMIN) TABS tablet Take 1 tablet by mouth daily.     Historical Provider, MD  promethazine (PHENERGAN) 25 MG tablet Take 1 tablet (25 mg total) by mouth every 6 (six) hours as needed for nausea or vomiting. 03/22/14   Kristen N Ward, DO   BP 105/75 mmHg  Pulse 77  Temp(Src) 98.9 F (37.2 C) (Oral)  Resp 16  Wt 97 lb (43.999 kg)  SpO2 100% Physical Exam  Constitutional: She is oriented to person, place, and time. She appears well-developed and well-nourished. No distress.  HENT:  Head: Normocephalic and atraumatic.  Mouth/Throat: Oropharynx is clear and moist.  Eyes: Conjunctivae are normal. Pupils are equal, round, and reactive to light.  Neck: Normal range of motion. Neck supple.  Cardiovascular: Normal rate, regular rhythm and intact distal pulses.   Pulmonary/Chest: Effort normal and breath sounds normal. No respiratory distress. She has no wheezes. She has no rales.  Abdominal: Bowel sounds are normal. She exhibits no mass. There is no tenderness. There is no rebound and no guarding.  Musculoskeletal: Normal range of motion.  Neurological: She is alert and oriented to person, place, and time.  Skin: Skin is warm and dry.  Psychiatric: She has a normal mood and affect.    ED Course  Procedures (including critical care time) Labs Review Labs Reviewed  URINALYSIS, ROUTINE W  REFLEX MICROSCOPIC (NOT AT [facility]) - Abnormal; Notable for the following:    Specific Gravity, Urine 1.031 (*)    Bilirubin Urine SMALL (*)    Ketones, ur 15 (*)    All other components within normal limits  BASIC METABOLIC PANEL - Abnormal; Notable for the following:    Glucose, Bld 102 (*)    Calcium 8.7 (*)    All other components within normal limits  PREGNANCY, URINE  URINE RAPID DRUG SCREEN, HOSP PERFORMED    Imaging Review No results found. I have personally reviewed and evaluated these images and lab  results as part of my medical decision-making.   EKG Interpretation None      MDM   Final diagnoses:  None    Results for orders placed or performed during the hospital encounter of 10/22/15  Urinalysis, Routine w reflex microscopic (not at [facility])  Result Value Ref Range   Color, Urine YELLOW YELLOW   APPearance CLEAR CLEAR   Specific Gravity, Urine 1.031 (H) 1.005 - 1.030   pH 5.5 5.0 - 8.0   Glucose, UA NEGATIVE NEGATIVE mg/dL   Hgb urine dipstick NEGATIVE NEGATIVE   Bilirubin Urine SMALL (A) NEGATIVE   Ketones, ur 15 (A) NEGATIVE mg/dL   Protein, ur NEGATIVE NEGATIVE mg/dL   Nitrite NEGATIVE NEGATIVE   Leukocytes, UA NEGATIVE NEGATIVE  Pregnancy, urine  Result Value Ref Range   Preg Test, Ur NEGATIVE NEGATIVE  Urine rapid drug screen (hosp performed)  Result Value Ref Range   Opiates NONE DETECTED NONE DETECTED   Cocaine NONE DETECTED NONE DETECTED   Benzodiazepines NONE DETECTED NONE DETECTED   Amphetamines NONE DETECTED NONE DETECTED   Tetrahydrocannabinol NONE DETECTED NONE DETECTED   Barbiturates NONE DETECTED NONE DETECTED  Basic metabolic panel  Result Value Ref Range   Sodium 139 135 - 145 mmol/L   Potassium 3.8 3.5 - 5.1 mmol/L   Chloride 108 101 - 111 mmol/L   CO2 24 22 - 32 mmol/L   Glucose, Bld 102 (H) 65 - 99 mg/dL   BUN 12 6 - 20 mg/dL   Creatinine, Ser 1.610.72 0.44 - 1.00 mg/dL   Calcium 8.7 (L) 8.9 - 10.3 mg/dL   GFR calc non Af Amer >60 >60 mL/min   GFR calc Af Amer >60 >60 mL/min   Anion gap 7 5 - 15   No results found.  Medications  sodium chloride 0.9 % bolus 500 mL (0 mLs Intravenous Stopped 10/22/15 0456)  promethazine (PHENERGAN) injection 12.5 mg (12.5 mg Intravenous Given 10/22/15 0425)   Feel better symptoms consistent with viral illness.  Strict return precautions given.  Follow up with your PMD    Zell Hylton, MD 10/22/15 [phone]

## 2015-10-22 NOTE — ED Notes (Signed)
C/o body aches chills and vomiting x 3 days

## 2015-10-22 NOTE — ED Notes (Signed)
N/v/d for three days

## 2016-04-07 ENCOUNTER — Encounter (HOSPITAL_BASED_OUTPATIENT_CLINIC_OR_DEPARTMENT_OTHER): Payer: Self-pay | Admitting: Emergency Medicine

## 2016-04-07 ENCOUNTER — Emergency Department (HOSPITAL_BASED_OUTPATIENT_CLINIC_OR_DEPARTMENT_OTHER)
Admission: EM | Admit: 2016-04-07 | Discharge: 2016-04-07 | Disposition: A | Discharge status: Home / Self Care | Payer: Medicaid Other | Attending: Emergency Medicine | Admitting: Emergency Medicine

## 2016-04-07 DIAGNOSIS — R112 Nausea with vomiting, unspecified: Secondary | ICD-10-CM | POA: Insufficient documentation

## 2016-04-07 DIAGNOSIS — R110 Nausea: Secondary | ICD-10-CM

## 2016-04-07 DIAGNOSIS — R5382 Chronic fatigue, unspecified: Secondary | ICD-10-CM | POA: Insufficient documentation

## 2016-04-07 LAB — URINALYSIS, ROUTINE W REFLEX MICROSCOPIC
Bilirubin Urine: NEGATIVE
Glucose, UA: NEGATIVE mg/dL
Hgb urine dipstick: NEGATIVE
Ketones, ur: NEGATIVE mg/dL
Leukocytes, UA: NEGATIVE
Nitrite: NEGATIVE
Protein, ur: NEGATIVE mg/dL
Specific Gravity, Urine: 1.024 (ref 1.005–1.030)
pH: 6 (ref 5.0–8.0)

## 2016-04-07 LAB — COMPREHENSIVE METABOLIC PANEL
ALT: 11 U/L — ABNORMAL LOW (ref 14–54)
AST: 15 U/L (ref 15–41)
Albumin: 5.1 g/dL — ABNORMAL HIGH (ref 3.5–5.0)
Alkaline Phosphatase: 60 U/L (ref 38–126)
Anion gap: 8 (ref 5–15)
BUN: 11 mg/dL (ref 6–20)
CO2: 26 mmol/L (ref 22–32)
Calcium: 9.5 mg/dL (ref 8.9–10.3)
Chloride: 103 mmol/L (ref 101–111)
Creatinine, Ser: 0.59 mg/dL (ref 0.44–1.00)
GFR calc Af Amer: >60 mL/min (ref >60)
GFR calc non Af Amer: >60 mL/min (ref >60)
Glucose, Bld: 84 mg/dL (ref 65–99)
Potassium: 4.1 mmol/L (ref 3.5–5.1)
Sodium: 137 mmol/L (ref 135–145)
Total Bilirubin: 1.4 mg/dL — ABNORMAL HIGH (ref 0.3–1.2)
Total Protein: 8.8 g/dL — ABNORMAL HIGH (ref 6.5–8.1)

## 2016-04-07 LAB — CBC WITH DIFFERENTIAL/PLATELET
Basophils Absolute: 0 10*3/uL (ref 0.0–0.1)
Basophils Relative: 0 %
Eosinophils Absolute: 0.1 10*3/uL (ref 0.0–0.7)
Eosinophils Relative: 2 %
HCT: 40.6 % (ref 36.0–46.0)
Hemoglobin: 14.2 g/dL (ref 12.0–15.0)
Lymphocytes Relative: 32 %
Lymphs Abs: 1.6 10*3/uL (ref 0.7–4.0)
MCH: 33.7 pg (ref 26.0–34.0)
MCHC: 35 g/dL (ref 30.0–36.0)
MCV: 96.4 fL (ref 78.0–100.0)
Monocytes Absolute: 0.3 10*3/uL (ref 0.1–1.0)
Monocytes Relative: 6 %
Neutro Abs: 3 10*3/uL (ref 1.7–7.7)
Neutrophils Relative %: 60 %
Platelets: 177 10*3/uL (ref 150–400)
RBC: 4.21 MIL/uL (ref 3.87–5.11)
RDW: 11.9 % (ref 11.5–15.5)
WBC: 5 10*3/uL (ref 4.0–10.5)

## 2016-04-07 LAB — PREGNANCY, URINE: Preg Test, Ur: NEGATIVE

## 2016-04-07 LAB — LIPASE, BLOOD: Lipase: 28 U/L (ref 11–51)

## 2016-04-07 MED ORDER — PROMETHAZINE HCL 25 MG PO TABS
12.5000 mg | ORAL_TABLET | Freq: Once | ORAL | Status: AC
  Administered 2016-04-07: 12.5 mg via ORAL
  Filled 2016-04-07: qty 1

## 2016-04-07 MED ORDER — PROMETHAZINE HCL 25 MG PO TABS: 25.0000 mg | ORAL_TABLET | Freq: Four times a day (QID) | ORAL | Status: DC | PRN

## 2016-04-07 NOTE — ED Provider Notes (Signed)
CSN: [id number]     Arrival date & time 04/07/16  1730 History   First MD Initiated Contact with Patient 04/07/16 1748     Chief Complaint  Patient presents with  . Emesis    HPI   25 year old female presents today with complaints of nausea and vomiting. Patient reports symptoms started 2 days ago with nausea, 2 episodes of vomiting yesterday, no vomiting but continued to be nauseous today. Patient reports this is similar to previous episodes of nausea and vomiting, likely related to her regular bowel movements. Patient reports that her last BM was yesterday, but before that had had 1 and 1.5 weeks. Patient reports that she feels sluggish, nauseous, chronically fatigued. Patient denies any associated abdominal pain, reports she's passing gas, denies any fever, projectile vomiting, lower abdominal pain, vaginal discharge or bleeding. Patient reports that when she feels this way, antinausea medication usually helps her symptoms.  Past Medical History  Diagnosis Date  . Sinus infection   . UTI (lower urinary tract infection)   . Pyelonephritis   . Chlamydia    Past Surgical History  Procedure Laterality Date  . No past surgeries     No family history on file. Social History  Substance Use Topics  . Smoking status: Never Smoker   . Smokeless tobacco: None  . Alcohol Use: No   OB History    Gravida Para Term Preterm AB TAB SAB Ectopic Multiple Living   1 1  1      0 1     Review of Systems  All other systems reviewed and are negative.  Allergies  Zofran  Home Medications   Prior to Admission medications   Medication Sig Start Date End Date Taking? Authorizing Provider  ibuprofen (ADVIL,MOTRIN) 800 MG tablet Take 1 tablet (800 mg total) by mouth every 8 (eight) hours as needed. 12/31/14   Essie HartWalda Pinn, MD  promethazine (PHENERGAN) 25 MG tablet Take 1 tablet (25 mg total) by mouth every 6 (six) hours as needed for nausea or vomiting. 04/07/16   Derius Ghosh, PA-C    BP 107/71 mmHg   Pulse 74  Temp(Src) 98.3 F (36.8 C) (Oral)  Resp 18  Ht 5\' 4"  (1.626 m)  Wt 44.453 kg  BMI 16.81 kg/m2  SpO2 100%   Physical Exam  Constitutional: She is oriented to person, place, and time. She appears well-developed and well-nourished.  HENT:  Head: Normocephalic and atraumatic.  Eyes: Conjunctivae are normal. Pupils are equal, round, and reactive to light. Right eye exhibits no discharge. Left eye exhibits no discharge. No scleral icterus.  Neck: Normal range of motion. No JVD present. No tracheal deviation present.  Pulmonary/Chest: Effort normal and breath sounds normal. No stridor. No respiratory distress. She has no wheezes. She has no rales.  Abdominal: Soft. She exhibits no distension and no mass. There is no tenderness. There is no rebound and no guarding.  Neurological: She is alert and oriented to person, place, and time. Coordination normal.  Skin: Skin is warm and dry. No rash noted. No erythema. No pallor.  Psychiatric: She has a normal mood and affect. Her behavior is normal. Judgment and thought content normal.  Nursing note and vitals reviewed.   ED Course  Procedures (including critical care time) Labs Review Labs Reviewed  COMPREHENSIVE METABOLIC PANEL - Abnormal; Notable for the following:    Total Protein 8.8 (*)    Albumin 5.1 (*)    ALT 11 (*)    Total Bilirubin 1.4 (*)  All other components within normal limits  URINALYSIS, ROUTINE W REFLEX MICROSCOPIC (NOT AT [facility])  PREGNANCY, URINE  CBC WITH DIFFERENTIAL/PLATELET  LIPASE, BLOOD    Imaging Review No results found. I have personally reviewed and evaluated these images and lab results as part of my medical decision-making.   EKG Interpretation None      MDM   Final diagnoses:  Nausea    Labs: CBC, CMP, lipase, urinalysis, urine pregnant- no significant findings  Imaging:  Consults:  Therapeutics: Promethazine  Discharge Meds:   Assessment/Plan: 25 year old female presents  today with nausea. Patient had episodes of vomiting yesterday, none today tolerating by mouth without difficulty. She is afebrile, nontoxic with a nontender abdomen. Vital signs reassuring, laboratory analysis reassuring. Patient likely having patient, uncertain etiology of today's nausea. Symptoms improved during the ED, similar presentations improved with medications. She'll be discharged home with instructions for change in diet, antinausea medication as needed, follow up with her primary care for reevaluation further management. Strict return precautions given.         Eyvonne Mechanic, PA-C 04/07/16 1941  Doug Sou, MD 04/07/16 2105

## 2016-04-07 NOTE — Discharge Instructions (Signed)
Nausea and Vomiting  Nausea means you feel sick to your stomach. Throwing up (vomiting) is a reflex where stomach contents come out of your mouth.  HOME CARE   · Take medicine as told by your doctor.  · Do not force yourself to eat. However, you do need to drink fluids.  · If you feel like eating, eat a normal diet as told by your doctor.    Eat rice, wheat, potatoes, bread, lean meats, yogurt, fruits, and vegetables.    Avoid high-fat foods.  · Drink enough fluids to keep your pee (urine) clear or pale yellow.  · Ask your doctor how to replace body fluid losses (rehydrate). Signs of body fluid loss (dehydration) include:    Feeling very thirsty.    Dry lips and mouth.    Feeling dizzy.    Dark pee.    Peeing less than normal.    Feeling confused.    Fast breathing or heart rate.  GET HELP RIGHT AWAY IF:   · You have blood in your throw up.  · You have black or bloody poop (stool).  · You have a bad headache or stiff neck.  · You feel confused.  · You have bad belly (abdominal) pain.  · You have chest pain or trouble breathing.  · You do not pee at least once every 8 hours.  · You have cold, clammy skin.  · You keep throwing up after 24 to 48 hours.  · You have a fever.  MAKE SURE YOU:   · Understand these instructions.  · Will watch your condition.  · Will get help right away if you are not doing well or get worse.     This information is not intended to replace advice given to you by your health care provider. Make sure you discuss any questions you have with your health care provider.     Document Released: 04/06/2008 Document Revised: 01/11/2012 Document Reviewed: 03/20/2011  Elsevier Interactive Patient Education ©2016 [employer]

## 2016-04-07 NOTE — ED Notes (Signed)
States has not vomited since yesterday

## 2016-04-07 NOTE — ED Notes (Signed)
Nausea and vomiting X 3 days.

## 2016-06-30 ENCOUNTER — Emergency Department (HOSPITAL_COMMUNITY)
Admission: EM | Admit: 2016-06-30 | Discharge: 2016-06-30 | Disposition: A | Discharge status: Home / Self Care | Payer: Medicaid Other | Attending: Emergency Medicine | Admitting: Emergency Medicine

## 2016-06-30 ENCOUNTER — Encounter (HOSPITAL_COMMUNITY): Payer: Self-pay | Admitting: Emergency Medicine

## 2016-06-30 DIAGNOSIS — M545 Low back pain, unspecified: Secondary | ICD-10-CM

## 2016-06-30 DIAGNOSIS — R110 Nausea: Secondary | ICD-10-CM | POA: Insufficient documentation

## 2016-06-30 DIAGNOSIS — R350 Frequency of micturition: Secondary | ICD-10-CM | POA: Insufficient documentation

## 2016-06-30 DIAGNOSIS — Z79899 Other long term (current) drug therapy: Secondary | ICD-10-CM | POA: Insufficient documentation

## 2016-06-30 LAB — URINALYSIS, ROUTINE W REFLEX MICROSCOPIC
BILIRUBIN URINE: NEGATIVE
Glucose, UA: NEGATIVE mg/dL
Hgb urine dipstick: NEGATIVE
Ketones, ur: NEGATIVE mg/dL
NITRITE: NEGATIVE
Protein, ur: NEGATIVE mg/dL
SPECIFIC GRAVITY, URINE: 1.025 (ref 1.005–1.030)
pH: 5 (ref 5.0–8.0)

## 2016-06-30 LAB — URINE MICROSCOPIC-ADD ON

## 2016-06-30 LAB — PREGNANCY, URINE: PREG TEST UR: NEGATIVE

## 2016-06-30 MED ORDER — IBUPROFEN 800 MG PO TABS
800.0000 mg | ORAL_TABLET | Freq: Once | ORAL | Status: AC
  Administered 2016-06-30: 800 mg via ORAL
  Filled 2016-06-30: qty 1

## 2016-06-30 MED ORDER — OXYBUTYNIN CHLORIDE ER 5 MG PO TB24: 5.0000 mg | ORAL_TABLET | Freq: Every day | ORAL | 0 refills | Status: DC

## 2016-06-30 MED ORDER — PROMETHAZINE HCL 25 MG PO TABS
25.0000 mg | ORAL_TABLET | Freq: Once | ORAL | Status: AC
  Administered 2016-06-30: 25 mg via ORAL
  Filled 2016-06-30: qty 1

## 2016-06-30 NOTE — ED Notes (Signed)
Patient is A & O x4.  She is ambulatory and has no questions regarding discharge instructions. 

## 2016-06-30 NOTE — ED Provider Notes (Signed)
[facility]-EMERGENCY DEPT Provider Note   CSN: [id number] Arrival date & time: 06/30/16  1038     History   Chief Complaint Chief Complaint  Patient presents with  . Flank Pain    HPI  Blood pressure 101/69, pulse 75, temperature 98.3 F (36.8 C), temperature source Oral, resp. rate 18, height 5\' 4"  (1.626 m), weight 46.3 kg, last menstrual period 06/26/2016, SpO2 100 %, unknown if currently breastfeeding.  Hannah Fuller is a 25 y.o. female complaining of strong smelling urine with urinary frequency, postvoid pressure low back pain and nausea worsening over the course of 3 days, she is taking ibuprofen 800 mg at home with good relief of the low back pain. She denies fever, chills, abdominal pain, vomiting, abnormal vaginal discharge. Tried to self treat with aggressive hydration and cranberry.   HPI  Past Medical History:  Diagnosis Date  . Chlamydia   . Pyelonephritis   . Sinus infection   . UTI (lower urinary tract infection)     Patient Active Problem List   Diagnosis Date Noted  . Postpartum state 12/30/2014  . Indication for care in labor or delivery 12/29/2014  . Preterm contractions 12/29/2014    Past Surgical History:  Procedure Laterality Date  . NO PAST SURGERIES      OB History    Gravida Para Term Preterm AB Living   1 1   1   1    SAB TAB Ectopic Multiple Live Births         0 1       Home Medications    Prior to Admission medications   Medication Sig Start Date End Date Taking? Authorizing Provider  Probiotic Product (PROBIOTIC PO) Take 1 tablet by mouth daily as needed for other. Digestive health...   (L.acid-L.casei-B.bif-B.lon-FOS (PROBIOTIC BLEND) 2 million cell-50 mg cap) 01/20/16  Yes Historical Provider, MD  oxybutynin (DITROPAN XL) 5 MG 24 hr tablet Take 1 tablet (5 mg total) by mouth at bedtime. 06/30/16   Hannah Ikner, PA-C  promethazine (PHENERGAN) 25 MG tablet Take 1 tablet (25 mg total) by mouth every 6 (six) hours as needed for nausea  or vomiting. Patient not taking: Reported on 06/30/2016 04/07/16   Hannah Mechanic, PA-C    Family History No family history on file.  Social History Social History  Substance Use Topics  . Smoking status: Never Smoker  . Smokeless tobacco: Never Used  . Alcohol use No     Allergies   Zofran [ondansetron hcl]   Review of Systems Review of Systems  10 systems reviewed and found to be negative, except as noted in the HPI.   Physical Exam Updated Vital Signs BP 112/80 (BP Location: Left Arm)   Pulse 70   Temp 98.4 F (36.9 C) (Oral)   Resp 18   Ht 5\' 4"  (1.626 m)   Wt 46.3 kg   LMP 06/26/2016   SpO2 100%   BMI 17.51 kg/m   Physical Exam  Constitutional: She is oriented to person, place, and time. She appears well-developed and well-nourished. No distress.  HENT:  Head: Normocephalic.  Mouth/Throat: Oropharynx is clear and moist.  Eyes: Conjunctivae and EOM are normal.  Neck: Normal range of motion.  Cardiovascular: Normal rate.   Pulmonary/Chest: Effort normal. No stridor.  Abdominal: Soft. Bowel sounds are normal. She exhibits no distension and no mass. There is no tenderness. There is no rebound and no guarding.  Genitourinary:  Genitourinary Comments: No CVA tenderness to percussion bilaterally  Musculoskeletal:  Normal range of motion.  Neurological: She is alert and oriented to person, place, and time.  Psychiatric: She has a normal mood and affect.  Nursing note and vitals reviewed.    ED Treatments / Results  Labs (all labs ordered are listed, but only abnormal results are displayed) Labs Reviewed  URINALYSIS, ROUTINE W REFLEX MICROSCOPIC (NOT AT [facility]) - Abnormal; Notable for the following:       Result Value   Leukocytes, UA SMALL (*)    All other components within normal limits  URINE MICROSCOPIC-ADD ON - Abnormal; Notable for the following:    Squamous Epithelial / LPF 0-5 (*)    Bacteria, UA RARE (*)    All other components within normal limits   URINE CULTURE  PREGNANCY, URINE    EKG  EKG Interpretation None       Radiology No results found.  Procedures Procedures (including critical care time)  Medications Ordered in ED Medications  ibuprofen (ADVIL,MOTRIN) tablet 800 mg (800 mg Oral Given 06/30/16 1233)  promethazine (PHENERGAN) tablet 25 mg (25 mg Oral Given 06/30/16 1233)     Initial Impression / Assessment and Plan / ED Course  I have reviewed the triage vital signs and the nursing notes.  Pertinent labs & imaging results that were available during my care of the patient were reviewed by me and considered in my medical decision making (see chart for details).  Clinical Course    Vitals:   06/30/16 1054 06/30/16 1055 06/30/16 1320  BP: 101/69  112/80  Pulse: 75  70  Resp: 18  18  Temp: 98.3 F (36.8 C)  98.4 F (36.9 C)  TempSrc: Oral  Oral  SpO2: 100%  100%  Weight:  46.3 kg   Height:  5\' 4"  (1.626 m)     Medications  ibuprofen (ADVIL,MOTRIN) tablet 800 mg (800 mg Oral Given 06/30/16 1233)  promethazine (PHENERGAN) tablet 25 mg (25 mg Oral Given 06/30/16 1233)    [name] is 25 y.o. female presenting with Low back pain, nausea urinary frequency and sensation of incomplete void with pressure after urination. No signs of systemic infection and no CVA tenderness to percussion to suggest pyelonephritis.  Surprisingly, this urinalysis is completely normal. I will order urine culture but I think this might be interstitial cystitis. Patient will be started on Ditropan and given referral to urology in case symptoms persist. I do not think this is originating from of vaginal infection and she denies any concern about STDs and has no vaginal discharge or abdominal pain.  Evaluation does not show pathology that would require ongoing emergent intervention or inpatient treatment. Pt is hemodynamically stable and mentating appropriately. Discussed findings and plan with patient/guardian, who agrees with care  plan. All questions answered. Return precautions discussed and outpatient follow up given.      Final Clinical Impressions(s) / ED Diagnoses   Final diagnoses:  Midline low back pain without sciatica  Urinary frequency    New Prescriptions Discharge Medication List as of 06/30/2016 12:45 PM    START taking these medications   Details  oxybutynin (DITROPAN XL) 5 MG 24 hr tablet Take 1 tablet (5 mg total) by mouth at bedtime., Starting Tue 06/30/2016, [employer]         Tamirah George, PA-C 06/30/16 1553    Geoffery Lyonsouglas Delo, MD 06/30/16 [phone]

## 2016-06-30 NOTE — Discharge Instructions (Signed)
Please follow with your primary care doctor in the next 2 days for a check-up. They must obtain records for further management.  ° °Do not hesitate to return to the Emergency Department for any new, worsening or concerning symptoms.  ° °

## 2016-06-30 NOTE — ED Triage Notes (Signed)
Patient reports bilateral flank pain and urinary frequency since Friday. Reports nausea/vomiting. Denies diarrhea. Hx of UTI.

## 2016-07-01 LAB — URINE CULTURE: CULTURE: NO GROWTH

## 2016-07-05 ENCOUNTER — Emergency Department (HOSPITAL_COMMUNITY)
Admission: EM | Admit: 2016-07-05 | Discharge: 2016-07-05 | Discharge status: Against Medical Advice | Payer: Medicaid Other

## 2016-07-05 NOTE — ED Notes (Signed)
ONCE PATIENT FOUND OUT HOW LONG WAIT WAS SHE DECIDED TO LEAVE AND COME BACK ANOTHER TIME.

## 2016-07-12 ENCOUNTER — Encounter (HOSPITAL_COMMUNITY): Payer: Self-pay | Admitting: *Deleted

## 2016-07-12 ENCOUNTER — Emergency Department (HOSPITAL_COMMUNITY)
Admission: EM | Admit: 2016-07-12 | Discharge: 2016-07-12 | Disposition: A | Discharge status: Home / Self Care | Payer: Medicaid Other | Attending: Emergency Medicine | Admitting: Emergency Medicine

## 2016-07-12 DIAGNOSIS — K529 Noninfective gastroenteritis and colitis, unspecified: Secondary | ICD-10-CM | POA: Insufficient documentation

## 2016-07-12 DIAGNOSIS — R0981 Nasal congestion: Secondary | ICD-10-CM | POA: Insufficient documentation

## 2016-07-12 LAB — URINALYSIS, ROUTINE W REFLEX MICROSCOPIC
Bilirubin Urine: NEGATIVE
Glucose, UA: NEGATIVE mg/dL
Hgb urine dipstick: NEGATIVE
KETONES UR: NEGATIVE mg/dL
LEUKOCYTES UA: NEGATIVE
NITRITE: NEGATIVE
PROTEIN: NEGATIVE mg/dL
Specific Gravity, Urine: 1.027 (ref 1.005–1.030)
pH: 5 (ref 5.0–8.0)

## 2016-07-12 LAB — POC URINE PREG, ED: PREG TEST UR: NEGATIVE

## 2016-07-12 MED ORDER — PROMETHAZINE HCL 25 MG PO TABS: 25.0000 mg | ORAL_TABLET | Freq: Four times a day (QID) | ORAL | 0 refills | Status: DC | PRN

## 2016-07-12 MED ORDER — PROMETHAZINE HCL 25 MG PO TABS
25.0000 mg | ORAL_TABLET | Freq: Once | ORAL | Status: AC
  Administered 2016-07-12: 25 mg via ORAL
  Filled 2016-07-12: qty 1

## 2016-07-12 NOTE — ED Triage Notes (Signed)
Patient states that she has been nauseated since last Sunday and vomited twice yesterday.  Patient reports, "been trying to deal with it with ginger ale".

## 2016-07-12 NOTE — ED Provider Notes (Signed)
[facility]ER[facility] DEPT Provider Note   CSN: [id number] Arrival date & time: 07/12/16  1045     History   Chief Complaint Chief Complaint  Patient presents with  . Nausea  . Emesis    HPI Hannah Fuller is a 25 y.o. female.  HPI   Since early last week has had nausea. Last night began throwing up, threw up twice.  With nausea tried salt, lemons, gingerale and sprite which helped some but won't take it away.  Friday had severe diarrhea/cramping 3 times.  Now stomach not cramping. Feeling weak and nausea now.  Last Saturday had urinary symptoms/blood in urine, and took old rx abx and now that has improved.   Daughter also with diarrhea since beginning of last week, nausea.  Past Medical History:  Diagnosis Date  . Chlamydia   . Pyelonephritis   . Sinus infection   . UTI (lower urinary tract infection)     Patient Active Problem List   Diagnosis Date Noted  . Postpartum state 12/30/2014  . Indication for care in labor or delivery 12/29/2014  . Preterm contractions 12/29/2014    Past Surgical History:  Procedure Laterality Date  . NO PAST SURGERIES      OB History    Gravida Para Term Preterm AB Living   1 1   1   1    SAB TAB Ectopic Multiple Live Births         0 1       Home Medications    Prior to Admission medications   Medication Sig Start Date End Date Taking? Authorizing Provider  oxybutynin (DITROPAN XL) 5 MG 24 hr tablet Take 1 tablet (5 mg total) by mouth at bedtime. Patient not taking: Reported on 07/12/2016 06/30/16   Joni ReiningNicole Pisciotta, PA-C  promethazine (PHENERGAN) 25 MG tablet Take 1 tablet (25 mg total) by mouth every 6 (six) hours as needed for nausea or vomiting. 07/12/16   Alvira MondayErin Bowen Goyal, MD    Family History No family history on file.  Social History Social History  Substance Use Topics  . Smoking status: Never Smoker  . Smokeless tobacco: Never Used  . Alcohol use No     Allergies   Zofran [ondansetron hcl]   Review of  Systems Review of Systems  Constitutional: Negative for fever.  HENT: Positive for congestion. Negative for sore throat.   Eyes: Negative for visual disturbance.  Respiratory: Negative for cough and shortness of breath.   Cardiovascular: Negative for chest pain.  Gastrointestinal: Positive for diarrhea, nausea and vomiting. Negative for abdominal pain.  Genitourinary: Negative for difficulty urinating.  Musculoskeletal: Negative for back pain and neck pain.  Skin: Negative for rash.  Neurological: Negative for syncope and headaches.     Physical Exam Updated Vital Signs BP 99/80 (BP Location: Right Arm)   Pulse 77   Temp 98.1 F (36.7 C) (Oral)   Resp 17   Ht 5\' 4"  (1.626 m)   Wt 102 lb (46.3 kg)   LMP 06/26/2016   SpO2 100%   BMI 17.51 kg/m   Physical Exam  Constitutional: She is oriented to person, place, and time. She appears well-developed and well-nourished. No distress.  HENT:  Head: Normocephalic and atraumatic.  Eyes: Conjunctivae and EOM are normal.  Neck: Normal range of motion.  Cardiovascular: Normal rate, regular rhythm, normal heart sounds and intact distal pulses.  Exam reveals no gallop and no friction rub.   No murmur heard. Pulmonary/Chest: Effort normal and breath sounds normal.  No respiratory distress. She has no wheezes. She has no rales.  Abdominal: Soft. She exhibits no distension. There is no tenderness. There is no guarding.  Musculoskeletal: She exhibits no edema or tenderness.  Neurological: She is alert and oriented to person, place, and time.  Skin: Skin is warm and dry. No rash noted. She is not diaphoretic. No erythema.  Nursing note and vitals reviewed.    ED Treatments / Results  Labs (all labs ordered are listed, but only abnormal results are displayed) Labs Reviewed  URINALYSIS, ROUTINE W REFLEX MICROSCOPIC (NOT AT [facility])  POC URINE PREG, ED    EKG  EKG Interpretation None       Radiology No results  found.  Procedures Procedures (including critical care time)  Medications Ordered in ED Medications  promethazine (PHENERGAN) tablet 25 mg (25 mg Oral Given 07/12/16 1252)     Initial Impression / Assessment and Plan / ED Course  I have reviewed the triage vital signs and the nursing notes.  Pertinent labs & imaging results that were available during my care of the patient were reviewed by me and considered in my medical decision making (see chart for details).  Clinical Course    25yo female presents with concern for nausea/emesis/diarrhea.  19mo old daughter here with similar.  Abdominal exam benign, doubt acute intraabdominal pathology including low suspicion for cholecystitis, appendicitis, nephrolithiasis, pancreatitis, PID, torsion.  UA WNL. Pregnancy negative.  Pt appears well hydrated. Suspect gastroenteritis by hx, physical, daughter with similar. Given phenergan and rx for same. Patient discharged in stable condition with understanding of reasons to return.   Final Clinical Impressions(s) / ED Diagnoses   Final diagnoses:  Gastroenteritis    New Prescriptions Discharge Medication List as of 07/12/2016 12:43 PM       Alvira Monday, MD 07/13/16 2152

## 2016-07-12 NOTE — ED Notes (Signed)
Pt refused DC vitals for herself and her daughter.

## 2016-07-12 NOTE — ED Notes (Signed)
Triage assessment and note performed bu Idara Woodside RN, was charted under NT+3 by accident.  

## 2016-10-07 ENCOUNTER — Emergency Department (HOSPITAL_COMMUNITY)
Admission: EM | Admit: 2016-10-07 | Discharge: 2016-10-08 | Disposition: A | Discharge status: Home / Self Care | Payer: Medicaid Other | Attending: Emergency Medicine | Admitting: Emergency Medicine

## 2016-10-07 ENCOUNTER — Encounter (HOSPITAL_COMMUNITY): Payer: Self-pay

## 2016-10-07 DIAGNOSIS — Z5321 Procedure and treatment not carried out due to patient leaving prior to being seen by health care provider: Secondary | ICD-10-CM | POA: Insufficient documentation

## 2016-10-07 DIAGNOSIS — R42 Dizziness and giddiness: Secondary | ICD-10-CM | POA: Insufficient documentation

## 2016-10-07 LAB — BASIC METABOLIC PANEL
ANION GAP: 9 (ref 5–15)
BUN: 11 mg/dL (ref 6–20)
CALCIUM: 9.2 mg/dL (ref 8.9–10.3)
CHLORIDE: 104 mmol/L (ref 101–111)
CO2: 25 mmol/L (ref 22–32)
Creatinine, Ser: 0.79 mg/dL (ref 0.44–1.00)
GFR calc non Af Amer: >60 mL/min (ref >60)
GLUCOSE: 88 mg/dL (ref 65–99)
POTASSIUM: 3.7 mmol/L (ref 3.5–5.1)
Sodium: 138 mmol/L (ref 135–145)

## 2016-10-07 LAB — CBC
HEMATOCRIT: 35.6 % — AB (ref 36.0–46.0)
HEMOGLOBIN: 11.9 g/dL — AB (ref 12.0–15.0)
MCH: 32.7 pg (ref 26.0–34.0)
MCHC: 33.4 g/dL (ref 30.0–36.0)
MCV: 97.8 fL (ref 78.0–100.0)
Platelets: 207 10*3/uL (ref 150–400)
RBC: 3.64 MIL/uL — AB (ref 3.87–5.11)
RDW: 12.3 % (ref 11.5–15.5)
WBC: 3.9 10*3/uL — ABNORMAL LOW (ref 4.0–10.5)

## 2016-10-07 LAB — I-STAT BETA HCG BLOOD, ED (MC, WL, AP ONLY)

## 2016-10-07 NOTE — ED Triage Notes (Addendum)
PT C/O DIZZINESS, NAUSEA, AND LIGHT-HEADEDNESS  SINCE Sunday UPON WAKING UP. PT STS WHEN SHE IS AT WORK SHE HAS BLURRED VISION, A HARD TIME CONCENTRATING, AND JUST WANTS TO SLEEP. PT STS THE NAUSEA HAS BEEN ONGOING FOR MORE THAN A YEAR, BUT STS IT HAS NEVER BEEN THIS BAD.

## 2017-01-22 ENCOUNTER — Emergency Department (HOSPITAL_BASED_OUTPATIENT_CLINIC_OR_DEPARTMENT_OTHER)
Admission: EM | Admit: 2017-01-22 | Discharge: 2017-01-22 | Disposition: A | Discharge status: Home / Self Care | Payer: Self-pay | Attending: Emergency Medicine | Admitting: Emergency Medicine

## 2017-01-22 ENCOUNTER — Emergency Department (HOSPITAL_BASED_OUTPATIENT_CLINIC_OR_DEPARTMENT_OTHER): Payer: Self-pay

## 2017-01-22 ENCOUNTER — Encounter (HOSPITAL_BASED_OUTPATIENT_CLINIC_OR_DEPARTMENT_OTHER): Payer: Self-pay | Admitting: *Deleted

## 2017-01-22 DIAGNOSIS — R05 Cough: Secondary | ICD-10-CM

## 2017-01-22 DIAGNOSIS — R059 Cough, unspecified: Secondary | ICD-10-CM

## 2017-01-22 DIAGNOSIS — J0100 Acute maxillary sinusitis, unspecified: Secondary | ICD-10-CM | POA: Insufficient documentation

## 2017-01-22 MED ORDER — AZITHROMYCIN 250 MG PO TABS: 250.0000 mg | ORAL_TABLET | Freq: Every day | ORAL | 0 refills | Status: DC

## 2017-01-22 MED ORDER — BENZONATATE 100 MG PO CAPS: 100.0000 mg | ORAL_CAPSULE | Freq: Three times a day (TID) | ORAL | 0 refills | Status: DC

## 2017-01-22 MED FILL — AZITHROMYCIN 250 MG TABLET: 250 | 5 days supply | Qty: 6 | Fill #0

## 2017-01-22 MED FILL — BENZONATATE 100 MG CAP: 100 | 7 days supply | Qty: 21 | Fill #0

## 2017-01-22 NOTE — ED Triage Notes (Signed)
Pt reports sinus pressure, headaches and non-productive cough since Saturday. Had a GI virus prior to these symptoms starting

## 2017-01-22 NOTE — ED Provider Notes (Signed)
Emergency Department Provider Note   I have reviewed the triage vital signs and the nursing notes.   HISTORY  Chief Complaint Cough   HPI Hannah Fuller is a 26 y.o. female presents to the emergency department for evaluation of sinusitis symptoms for the past week. She describes frontal sinus pressure, cough, runny nose. No sick contacts. She denies any productive cough. No fevers or chills. No difficulty breathing. Tried multiple over-the-counter pain medications without relief. She feels associated fatigue. No abdominal discomfort. No vomiting or diarrhea. No modifying factors. No radiation of symptoms.    Past Medical History:  Diagnosis Date  . Chlamydia   . Pyelonephritis   . Sinus infection   . UTI (lower urinary tract infection)     Patient Active Problem List   Diagnosis Date Noted  . Postpartum state 12/30/2014  . Indication for care in labor or delivery 12/29/2014  . Preterm contractions 12/29/2014    Past Surgical History:  Procedure Laterality Date  . NO PAST SURGERIES      Current Outpatient Rx  . Order #: [id number] Class: Print  . Order #: [id number] Class: Print  . Order #: [id number] Class: Print  . Order #: [id number] Class: Print    Allergies Zofran Frazier Richards[ondansetron hcl]  No family history on file.  Social History Social History  Substance Use Topics  . Smoking status: Never Smoker  . Smokeless tobacco: Never Used  . Alcohol use No    Review of Systems  10-point ROS otherwise negative.  ____________________________________________   PHYSICAL EXAM:  VITAL SIGNS: ED Triage Vitals [01/22/17 1050]  Enc Vitals Group     BP 116/75     Pulse Rate 83     Resp 16     Temp 98.9 F (37.2 C)     Temp Source Oral     SpO2 100 %     Weight 98 lb (44.5 kg)     Height 5\' 4"  (1.626 m)     Pain Score 8   Constitutional: Alert and oriented. Well appearing and in no acute distress. Eyes: Conjunctivae are normal.  Head: Atraumatic. Nose: No  congestion/rhinnorhea. Mouth/Throat: Mucous membranes are moist.  Oropharynx non-erythematous. Mild maxillary sinus pressure.  Neck: No stridor.  Cardiovascular: Normal rate, regular rhythm. Good peripheral circulation. Grossly normal heart sounds.   Respiratory: Normal respiratory effort.  No retractions. Lungs CTAB. Gastrointestinal: Soft and nontender. No distention.  Musculoskeletal: No lower extremity tenderness nor edema. No gross deformities of extremities. Neurologic:  Normal speech and language. No gross focal neurologic deficits are appreciated.  Skin:  Skin is warm, dry and intact. No rash noted. Psychiatric: Mood and affect are normal. Speech and behavior are normal. ____________________________________________  RADIOLOGY  Dg Chest 2 View  Result Date: 01/22/2017 CLINICAL DATA:  Cough and congestion for 6 days EXAM: CHEST  2 VIEW COMPARISON:  08/25/2012 FINDINGS: The heart size and mediastinal contours are within normal limits. Both lungs are clear. The visualized skeletal structures are unremarkable. IMPRESSION: No active cardiopulmonary disease. Electronically Signed   By: Judie PetitM.  Shick M.D.   On: 01/22/2017 11:53    ____________________________________________   PROCEDURES  Procedure(s) performed:   Procedures  None ____________________________________________   INITIAL IMPRESSION / ASSESSMENT AND PLAN / ED COURSE  Pertinent labs & imaging results that were available during my care of the patient were reviewed by me and considered in my medical decision making (see chart for details).  Patient resents to the emergency room in for evaluation of sinusitis symptoms. She  has tenderness over the maxillary sinuses bilaterally. Normal chest x-ray. Unremarkable vital signs. The patient is well-appearing. With 7 days of symptoms plan for brief course of azithromycin and Tessalon for symptomatically management. We'll follow with primary care physician as needed.  At this time,  I do not feel there is any life-threatening condition present. I have reviewed and discussed all results (EKG, imaging, lab, urine as appropriate), exam findings with patient. I have reviewed nursing notes and appropriate previous records.  I feel the patient is safe to be discharged home without further emergent workup. Discussed usual and customary return precautions. Patient and family (if present) verbalize understanding and are comfortable with this plan.  Patient will follow-up with their primary care provider. If they do not have a primary care provider, information for follow-up has been provided to them. All questions have been answered.    ____________________________________________  FINAL CLINICAL IMPRESSION(S) / ED DIAGNOSES  Final diagnoses:  Cough  Acute non-recurrent maxillary sinusitis     MEDICATIONS GIVEN DURING THIS VISIT:  None  NEW OUTPATIENT MEDICATIONS STARTED DURING THIS VISIT:  New Prescriptions   AZITHROMYCIN (ZITHROMAX) 250 MG TABLET    Take 1 tablet (250 mg total) by mouth daily. Take first 2 tablets together, then 1 every day until finished.   BENZONATATE (TESSALON) 100 MG CAPSULE    Take 1 capsule (100 mg total) by mouth every 8 (eight) hours.     Note:  This document was prepared using Dragon voice recognition software and may include unintentional dictation errors.  Alona Bene, MD Emergency Medicine   Maia Plan, MD 01/22/17 [phone]

## 2017-01-22 NOTE — Discharge Instructions (Signed)

## 2017-01-22 NOTE — ED Notes (Signed)
ED Provider at bedside. (pa student) 

## 2017-01-22 NOTE — ED Notes (Signed)
Pt shown were to pick up rx

## 2017-08-07 ENCOUNTER — Emergency Department (HOSPITAL_BASED_OUTPATIENT_CLINIC_OR_DEPARTMENT_OTHER)
Admission: EM | Admit: 2017-08-07 | Discharge: 2017-08-07 | Disposition: A | Discharge status: Home / Self Care | Payer: BLUE CROSS/BLUE SHIELD | Attending: Emergency Medicine | Admitting: Emergency Medicine

## 2017-08-07 ENCOUNTER — Encounter (HOSPITAL_BASED_OUTPATIENT_CLINIC_OR_DEPARTMENT_OTHER): Payer: Self-pay | Admitting: Emergency Medicine

## 2017-08-07 DIAGNOSIS — Z3A00 Weeks of gestation of pregnancy not specified: Secondary | ICD-10-CM | POA: Diagnosis not present

## 2017-08-07 DIAGNOSIS — Z79899 Other long term (current) drug therapy: Secondary | ICD-10-CM | POA: Diagnosis not present

## 2017-08-07 DIAGNOSIS — O219 Vomiting of pregnancy, unspecified: Secondary | ICD-10-CM

## 2017-08-07 DIAGNOSIS — O218 Other vomiting complicating pregnancy: Secondary | ICD-10-CM | POA: Diagnosis not present

## 2017-08-07 DIAGNOSIS — O9989 Other specified diseases and conditions complicating pregnancy, childbirth and the puerperium: Secondary | ICD-10-CM | POA: Diagnosis present

## 2017-08-07 LAB — URINALYSIS, ROUTINE W REFLEX MICROSCOPIC
BILIRUBIN URINE: NEGATIVE
GLUCOSE, UA: NEGATIVE mg/dL
HGB URINE DIPSTICK: NEGATIVE
Ketones, ur: NEGATIVE mg/dL
Leukocytes, UA: NEGATIVE
Nitrite: NEGATIVE
Protein, ur: NEGATIVE mg/dL
SPECIFIC GRAVITY, URINE: 1.025 (ref 1.005–1.030)
pH: 6 (ref 5.0–8.0)

## 2017-08-07 LAB — BASIC METABOLIC PANEL
ANION GAP: 7 (ref 5–15)
BUN: 10 mg/dL (ref 6–20)
CALCIUM: 8.9 mg/dL (ref 8.9–10.3)
CHLORIDE: 102 mmol/L (ref 101–111)
CO2: 23 mmol/L (ref 22–32)
Creatinine, Ser: 0.54 mg/dL (ref 0.44–1.00)
GFR calc Af Amer: >60 mL/min (ref >60)
GFR calc non Af Amer: >60 mL/min (ref >60)
GLUCOSE: 77 mg/dL (ref 65–99)
Potassium: 3.6 mmol/L (ref 3.5–5.1)
Sodium: 132 mmol/L — ABNORMAL LOW (ref 135–145)

## 2017-08-07 LAB — PREGNANCY, URINE: Preg Test, Ur: POSITIVE — AB

## 2017-08-07 LAB — HCG, QUANTITATIVE, PREGNANCY: hCG, Beta Chain, Quant, S: 141974 m[IU]/mL — ABNORMAL HIGH (ref <5)

## 2017-08-07 MED ORDER — PROMETHAZINE HCL 25 MG PO TABS: 25.0000 mg | ORAL_TABLET | Freq: Four times a day (QID) | ORAL | 0 refills | Status: DC | PRN

## 2017-08-07 MED ORDER — SODIUM CHLORIDE 0.9 % IV BOLUS (SEPSIS)
1000.0000 mL | Freq: Once | INTRAVENOUS | Status: AC
  Administered 2017-08-07: 1000 mL via INTRAVENOUS

## 2017-08-07 MED ORDER — PROMETHAZINE HCL 25 MG/ML IJ SOLN
25.0000 mg | Freq: Once | INTRAMUSCULAR | Status: AC
  Administered 2017-08-07: 25 mg via INTRAVENOUS
  Filled 2017-08-07: qty 1

## 2017-08-07 NOTE — ED Triage Notes (Signed)
N/V since Tuesday, denies diarrhea or abd pain

## 2017-08-07 NOTE — ED Provider Notes (Signed)
[facility] Provider Note   CSN: [id number] Arrival date & time: 08/07/17  1234     History   Chief Complaint Chief Complaint  Patient presents with  . Nausea    HPI Hannah Fuller is a 26 y.o. female who presents to the Emergency Department with intermittent non-bloody, non-bilious emesis and nausea, onset 5 days ago. She denies fever, chills, abdominal pain, or diarrhea. No treatment prior to arrival. She reports her symptoms are worse after eating. No alleviating factors. No back pain, dysuria, vaginal pain, bleeding, or discharge. She has had little PO intake since onset.                                                                  She is allergic to Zofran.   The history is provided by the patient. No language interpreter was used.    Past Medical History:  Diagnosis Date  . Chlamydia   . Pyelonephritis   . Sinus infection   . UTI (lower urinary tract infection)     Patient Active Problem List   Diagnosis Date Noted  . Postpartum state 12/30/2014  . Indication for care in labor or delivery 12/29/2014  . Preterm contractions 12/29/2014    Past Surgical History:  Procedure Laterality Date  . NO PAST SURGERIES      OB History    Gravida Para Term Preterm AB Living   SAB TAB Ectopic Multiple Live Births         0 1       Home Medications    Prior to Admission medications   Medication Sig Start Date End Date Taking? Authorizing Provider  azithromycin (ZITHROMAX) 250 MG tablet Take 1 tablet (250 mg total) by mouth daily. Take first 2 tablets together, then 1 every day until finished. 01/22/17   Long, Arlyss Repress, MD  benzonatate (TESSALON) 100 MG capsule Take 1 capsule (100 mg total) by mouth every 8 (eight) hours. 01/22/17   Long, Arlyss Repress, MD  oxybutynin (DITROPAN XL) 5 MG 24 hr tablet Take 1 tablet (5 mg total) by mouth at bedtime. Patient not taking: Reported on 07/12/2016 06/30/16   Pisciotta, Joni Reining, PA-C  promethazine (PHENERGAN)  25 MG tablet Take 1 tablet (25 mg total) by mouth every 6 (six) hours as needed for nausea or vomiting. 08/07/17   McDonald, Mia A, PA-C    Family History No family history on file.  Social History Social History  Substance Use Topics  . Smoking status: Never Smoker  . Smokeless tobacco: Never Used  . Alcohol use No     Allergies   Zofran [ondansetron hcl]   Review of Systems Review of Systems  Constitutional: Negative for activity change.  Respiratory: Negative for shortness of breath.   Cardiovascular: Negative for chest pain.  Gastrointestinal: Positive for nausea and vomiting. Negative for abdominal distention, abdominal pain and diarrhea.  Genitourinary: Negative for dysuria, vaginal discharge and vaginal pain.  Musculoskeletal: Negative for back pain.  Skin: Negative for rash.     Physical Exam Updated Vital Signs BP 90/72 (BP Location: Right Arm)   Pulse 71   Temp 98.8 F (37.1 C) (Oral)   Resp 18   Ht 5'  4" (1.626 m)   Wt 49.1 kg (108 lb 5 oz)   LMP 06/09/2017   SpO2 100%   BMI 18.59 kg/m   Physical Exam  Constitutional: No distress.  HENT:  Head: Normocephalic.  Eyes: Conjunctivae are normal.  Neck: Neck supple.  Cardiovascular: Normal rate, regular rhythm and normal heart sounds.  Exam reveals no gallop and no friction rub.   No murmur heard. Pulmonary/Chest: Effort normal and breath sounds normal. No respiratory distress. She has no wheezes. She has no rales. She exhibits no tenderness.  Abdominal: Soft. Bowel sounds are normal. She exhibits no distension and no mass. There is no tenderness. There is no rebound and no guarding. No hernia.  No CVA tenderness bilaterally.   Neurological: She is alert.  Skin: Skin is warm. Capillary refill takes less than 2 seconds. No rash noted.  Psychiatric: Her behavior is normal.  Nursing note and vitals reviewed.    ED Treatments / Results  Labs (all labs ordered are listed, but only abnormal results are  displayed) Labs Reviewed  PREGNANCY, URINE - Abnormal; Notable for the following:       Result Value   Preg Test, Ur POSITIVE (*)    All other components within normal limits  HCG, QUANTITATIVE, PREGNANCY - Abnormal; Notable for the following:    hCG, Beta Chain, Quant, S 141,974 (*)    All other components within normal limits  BASIC METABOLIC PANEL - Abnormal; Notable for the following:    Sodium 132 (*)    All other components within normal limits  URINALYSIS, ROUTINE W REFLEX MICROSCOPIC    EKG  EKG Interpretation None       Radiology No results found.  Procedures Procedures (including critical care time)  Medications Ordered in ED Medications  promethazine (PHENERGAN) injection 25 mg (25 mg Intravenous Given 08/07/17 1416)  sodium chloride 0.9 % bolus 1,000 mL (0 mLs Intravenous Stopped 08/07/17 1445)     Initial Impression / Assessment and Plan / ED Course  I have reviewed the triage vital signs and the nursing notes.  Pertinent labs & imaging results that were available during my care of the patient were reviewed by me and considered in my medical decision making (see chart for details).    26 year old female presenting with 5 days of nausea and vomiting. No constitutional symptoms. No abdominal or back pain or diarrhea. Urine pregnancy test positive. Quantitative pregnancy test 141,974. LMP 06/09/17. IVF and phenergan given since the patient is allergic to Zofran with resolution of her N/V. Successfully PO challenged. Low suspicion for ectopic pregnancy at this time. Her back and abdominal exam are unremarkable. She is established with OBGYN. She has a h/o of hyperemesis gravidarum during her last pregnancy. Will d/c to home with phenergan and follow up to OBGYN. The patient is agreeable at this time. Strict return precautions given. NAD. The patient is safe for d/c at this time.    Final Clinical Impressions(s) / ED Diagnoses   Final diagnoses:  Nausea/vomiting in  pregnancy    New Prescriptions Discharge Medication List as of 08/07/2017  3:20 PM       Frederik Pear A, PA-C 08/08/17 2244    Long, Arlyss Repress, MD 08/09/17 [phone]

## 2017-08-07 NOTE — Discharge Instructions (Signed)
Please follow up with your OBGYN regarding today's positive pregnancy test during your Emergency Department visit.   If you develop any vaginal bleeding or spotting, abdominal or back pain, or if you are unable to keep down fluids or food due to vomiting, please return to the Emergency Department for re-evaluation.   Diclegis is a brand name medication that is used for nausea and vomiting during pregnancy. This medication can also be made at home when you use Vitamin B6 and Unisom. It is very effective for morning sickness during pregnancy.

## 2017-08-18 LAB — OB RESULTS CONSOLE GC/CHLAMYDIA: Gonorrhea: NEGATIVE

## 2017-09-14 LAB — OB RESULTS CONSOLE RUBELLA ANTIBODY, IGM: RUBELLA: IMMUNE

## 2017-09-14 LAB — OB RESULTS CONSOLE HEPATITIS B SURFACE ANTIGEN: HEP B S AG: NEGATIVE

## 2017-09-14 LAB — OB RESULTS CONSOLE HIV ANTIBODY (ROUTINE TESTING): HIV: NONREACTIVE

## 2017-11-02 NOTE — L&D Delivery Note (Signed)
Delivery Note At 1:14 PM a viable and healthy female was delivered via Vaginal, Spontaneous (Presentation: left occiput; anterior ).  APGAR: 9, 9; weight pending .   Placenta status: spontaneous, intact.  Cord: 3V   Anesthesia:  Epidural Episiotomy: None Lacerations: None Suture Repair: NA Est. Blood Loss (mL): 50  Mom to postpartum.  Baby to Couplet care / Skin to Skin.  Waynard Reeds 03/05/2018, 1:41 PM

## 2017-11-09 ENCOUNTER — Inpatient Hospital Stay (HOSPITAL_COMMUNITY): Payer: BLUE CROSS/BLUE SHIELD

## 2017-11-09 ENCOUNTER — Encounter (HOSPITAL_COMMUNITY): Payer: Self-pay | Admitting: *Deleted

## 2017-11-09 ENCOUNTER — Inpatient Hospital Stay (HOSPITAL_COMMUNITY): Payer: BLUE CROSS/BLUE SHIELD | Admitting: Anesthesiology

## 2017-11-09 ENCOUNTER — Observation Stay (HOSPITAL_COMMUNITY)
Admission: AD | Admit: 2017-11-09 | Discharge: 2017-11-10 | Disposition: A | Discharge status: Home / Self Care | Payer: BLUE CROSS/BLUE SHIELD | Source: Ambulatory Visit | Attending: Obstetrics and Gynecology | Admitting: Obstetrics and Gynecology

## 2017-11-09 ENCOUNTER — Encounter (HOSPITAL_COMMUNITY)
Admission: AD | Disposition: A | Discharge status: Home / Self Care | Payer: Self-pay | Source: Ambulatory Visit | Attending: Obstetrics and Gynecology

## 2017-11-09 ENCOUNTER — Other Ambulatory Visit: Payer: Self-pay

## 2017-11-09 DIAGNOSIS — O3432 Maternal care for cervical incompetence, second trimester: Principal | ICD-10-CM | POA: Insufficient documentation

## 2017-11-09 DIAGNOSIS — O09892 Supervision of other high risk pregnancies, second trimester: Secondary | ICD-10-CM

## 2017-11-09 DIAGNOSIS — Z3A20 20 weeks gestation of pregnancy: Secondary | ICD-10-CM | POA: Diagnosis not present

## 2017-11-09 DIAGNOSIS — Z888 Allergy status to other drugs, medicaments and biological substances status: Secondary | ICD-10-CM | POA: Diagnosis not present

## 2017-11-09 DIAGNOSIS — O26892 Other specified pregnancy related conditions, second trimester: Secondary | ICD-10-CM

## 2017-11-09 DIAGNOSIS — N898 Other specified noninflammatory disorders of vagina: Secondary | ICD-10-CM

## 2017-11-09 DIAGNOSIS — Z3689 Encounter for other specified antenatal screening: Secondary | ICD-10-CM

## 2017-11-09 DIAGNOSIS — O09212 Supervision of pregnancy with history of pre-term labor, second trimester: Secondary | ICD-10-CM | POA: Diagnosis not present

## 2017-11-09 DIAGNOSIS — O3430 Maternal care for cervical incompetence, unspecified trimester: Secondary | ICD-10-CM | POA: Diagnosis present

## 2017-11-09 HISTORY — DX: Other specified health status: Z78.9

## 2017-11-09 HISTORY — PX: CERVICAL CERCLAGE: SHX1329

## 2017-11-09 LAB — CBC
HCT: 34.1 % — ABNORMAL LOW (ref 36.0–46.0)
HCT: 28.7 % — ABNORMAL LOW (ref 36.0–46.0)
HEMOGLOBIN: 9.9 g/dL — AB (ref 12.0–15.0)
Hemoglobin: 11.4 g/dL — ABNORMAL LOW (ref 12.0–15.0)
MCH: 32.8 pg (ref 26.0–34.0)
MCH: 33.4 pg (ref 26.0–34.0)
MCHC: 33.4 g/dL (ref 30.0–36.0)
MCHC: 34.5 g/dL (ref 30.0–36.0)
MCV: 98 fL (ref 78.0–100.0)
MCV: 97 fL (ref 78.0–100.0)
PLATELETS: 197 10*3/uL (ref 150–400)
Platelets: 154 10*3/uL (ref 150–400)
RBC: 3.48 MIL/uL — ABNORMAL LOW (ref 3.87–5.11)
RBC: 2.96 MIL/uL — AB (ref 3.87–5.11)
RDW: 12.9 % (ref 11.5–15.5)
RDW: 12.7 % (ref 11.5–15.5)
WBC: 9.1 10*3/uL (ref 4.0–10.5)
WBC: 10.8 10*3/uL — AB (ref 4.0–10.5)

## 2017-11-09 LAB — URINALYSIS, ROUTINE W REFLEX MICROSCOPIC
BILIRUBIN URINE: NEGATIVE
GLUCOSE, UA: NEGATIVE mg/dL
Hgb urine dipstick: NEGATIVE
KETONES UR: NEGATIVE mg/dL
Leukocytes, UA: NEGATIVE
NITRITE: NEGATIVE
PH: 6 (ref 5.0–8.0)
Protein, ur: NEGATIVE mg/dL
SPECIFIC GRAVITY, URINE: 1.025 (ref 1.005–1.030)

## 2017-11-09 LAB — TYPE AND SCREEN
ABO/RH(D): B POS
ANTIBODY SCREEN: NEGATIVE

## 2017-11-09 LAB — ABO/RH: ABO/RH(D): B POS

## 2017-11-09 SURGERY — CERCLAGE, CERVIX, VAGINAL APPROACH
Anesthesia: Spinal | Site: Vagina

## 2017-11-09 MED ORDER — PROMETHAZINE HCL 25 MG/ML IJ SOLN
6.2500 mg | INTRAMUSCULAR | Status: DC | PRN
Start: 2017-11-09 — End: 2017-11-09
  Administered 2017-11-09: 6.25 mg via INTRAVENOUS

## 2017-11-09 MED ORDER — FENTANYL CITRATE (PF) 100 MCG/2ML IJ SOLN
25.0000 ug | INTRAMUSCULAR | Status: DC | PRN
  Administered 2017-11-09: 25 ug via INTRAVENOUS

## 2017-11-09 MED ORDER — PROMETHAZINE HCL 25 MG/ML IJ SOLN
INTRAMUSCULAR | Status: AC
  Filled 2017-11-09: qty 1

## 2017-11-09 MED ORDER — PHENYLEPHRINE 40 MCG/ML (10ML) SYRINGE FOR IV PUSH (FOR BLOOD PRESSURE SUPPORT)
PREFILLED_SYRINGE | INTRAVENOUS | Status: AC
  Filled 2017-11-09: qty 10

## 2017-11-09 MED ORDER — PHENYLEPHRINE HCL 10 MG/ML IJ SOLN
INTRAMUSCULAR | Status: DC | PRN
  Administered 2017-11-09 (×3): 80 ug via INTRAVENOUS

## 2017-11-09 MED ORDER — HYDROCODONE-ACETAMINOPHEN 5-325 MG PO TABS
1.0000 | ORAL_TABLET | ORAL | Status: DC | PRN
  Administered 2017-11-09: 1 via ORAL
  Filled 2017-11-09: qty 2

## 2017-11-09 MED ORDER — PROMETHAZINE HCL 25 MG PO TABS
25.0000 mg | ORAL_TABLET | Freq: Four times a day (QID) | ORAL | Status: DC | PRN
  Administered 2017-11-09 – 2017-11-10 (×3): 25 mg via ORAL
  Filled 2017-11-09 (×3): qty 1

## 2017-11-09 MED ORDER — DOCUSATE SODIUM 100 MG PO CAPS
100.0000 mg | ORAL_CAPSULE | Freq: Every day | ORAL | Status: DC
  Filled 2017-11-09: qty 1

## 2017-11-09 MED ORDER — HYDROXYPROGESTERONE CAPROATE 250 MG/ML IM OIL
250.0000 mg | TOPICAL_OIL | Freq: Once | INTRAMUSCULAR | Status: AC
  Administered 2017-11-09: 250 mg via INTRAMUSCULAR
  Filled 2017-11-09: qty 1

## 2017-11-09 MED ORDER — CALCIUM CARBONATE ANTACID 500 MG PO CHEW
2.0000 | CHEWABLE_TABLET | ORAL | Status: DC | PRN
Start: 2017-11-09 — End: 2017-11-10

## 2017-11-09 MED ORDER — INDOMETHACIN 25 MG PO CAPS
25.0000 mg | ORAL_CAPSULE | Freq: Four times a day (QID) | ORAL | Status: DC
  Administered 2017-11-09 – 2017-11-10 (×4): 25 mg via ORAL
  Filled 2017-11-09 (×8): qty 1

## 2017-11-09 MED ORDER — TERBUTALINE SULFATE 1 MG/ML IJ SOLN
INTRAMUSCULAR | Status: AC
  Filled 2017-11-09: qty 1

## 2017-11-09 MED ORDER — CEFAZOLIN SODIUM-DEXTROSE 2-4 GM/100ML-% IV SOLN
2.0000 g | Freq: Once | INTRAVENOUS | Status: AC
  Administered 2017-11-09: 2 g via INTRAVENOUS
  Filled 2017-11-09: qty 100

## 2017-11-09 MED ORDER — TERBUTALINE SULFATE 1 MG/ML IJ SOLN
INTRAMUSCULAR | Status: DC | PRN
  Administered 2017-11-09: 250 ug via SUBCUTANEOUS

## 2017-11-09 MED ORDER — PRENATAL MULTIVITAMIN CH: 1.0000 | ORAL_TABLET | Freq: Every day | ORAL | Status: DC

## 2017-11-09 MED ORDER — ZOLPIDEM TARTRATE 5 MG PO TABS
5.0000 mg | ORAL_TABLET | Freq: Every evening | ORAL | Status: DC | PRN
  Administered 2017-11-10: 5 mg via ORAL
  Filled 2017-11-09: qty 1

## 2017-11-09 MED ORDER — FENTANYL CITRATE (PF) 100 MCG/2ML IJ SOLN
INTRAMUSCULAR | Status: AC
  Filled 2017-11-09: qty 2

## 2017-11-09 MED ORDER — INDOMETHACIN 50 MG RE SUPP
50.0000 mg | Freq: Once | RECTAL | Status: AC
  Administered 2017-11-09: 50 mg via RECTAL
  Filled 2017-11-09: qty 1

## 2017-11-09 MED ORDER — ACETAMINOPHEN 325 MG PO TABS
650.0000 mg | ORAL_TABLET | ORAL | Status: DC | PRN
  Administered 2017-11-09: 650 mg via ORAL
  Filled 2017-11-09: qty 2

## 2017-11-09 MED ORDER — BUPIVACAINE HCL (PF) 0.75 % IJ SOLN
INTRAMUSCULAR | Status: DC | PRN
  Administered 2017-11-09: 1 mL

## 2017-11-09 MED ORDER — LACTATED RINGERS IV SOLN
INTRAVENOUS | Status: DC
  Administered 2017-11-09: 14:00:00 via INTRAVENOUS
  Administered 2017-11-09: 1000 mL via INTRAVENOUS
  Administered 2017-11-10: via INTRAVENOUS

## 2017-11-09 SURGICAL SUPPLY — 17 items
CANISTER SUCT 3000ML PPV (MISCELLANEOUS) ×3 IMPLANT
GLOVE BIO SURGEON STRL SZ7 (GLOVE) ×3 IMPLANT
GLOVE BIOGEL PI IND STRL 7.0 (GLOVE) ×1 IMPLANT
GLOVE BIOGEL PI INDICATOR 7.0 (GLOVE) ×2
GOWN STRL REUS W/TWL LRG LVL3 (GOWN DISPOSABLE) ×6 IMPLANT
NEEDLE MAYO CATGUT SZ4 (NEEDLE) ×3 IMPLANT
NS IRRIG 1000ML POUR BTL (IV SOLUTION) ×3 IMPLANT
PACK VAGINAL MINOR WOMEN LF (CUSTOM PROCEDURE TRAY) ×3 IMPLANT
PAD OB MATERNITY 4.3X12.25 (PERSONAL CARE ITEMS) ×3 IMPLANT
PAD PREP 24X48 CUFFED NSTRL (MISCELLANEOUS) ×3 IMPLANT
SUT ETHIBOND  5 (SUTURE) ×2
SUT ETHIBOND 5 (SUTURE) ×1 IMPLANT
TOWEL OR 17X24 6PK STRL BLUE (TOWEL DISPOSABLE) ×6 IMPLANT
TRAY FOLEY CATH SILVER 14FR (SET/KITS/TRAYS/PACK) ×3 IMPLANT
TUBING NON-CON 1/4 X 20 CONN (TUBING) IMPLANT
TUBING NON-CON 1/4 X 20' CONN (TUBING)
YANKAUER SUCT BULB TIP NO VENT (SUCTIONS) IMPLANT

## 2017-11-09 NOTE — Anesthesia Procedure Notes (Signed)
Spinal  Patient location during procedure: OR Staffing Anesthesiologist: Cristela BlueJackson, Lylee Corrow, MD Spinal Block Patient position: sitting Prep: DuraPrep Patient monitoring: heart rate, blood pressure and continuous pulse ox Approach: right paramedian Location: L3-4 Injection technique: single-shot Needle Needle type: Sprotte  Needle gauge: 24 G Needle length: 9 cm Assessment Sensory level: T4 Additional Notes Spinal Dosage in OR  .75% Bupivicaine ml       1.2

## 2017-11-09 NOTE — MAU Provider Note (Signed)
History     CSN: [id number]  Arrival date and time: 11/09/17 4098   First Provider Initiated Contact with Patient 11/09/17 [phone]      Chief Complaint  Patient presents with  . Contractions  . Vaginal Bleeding   HPI  Ms.  Hannah Fuller is a 27 y.o. year old G48P0101 female at [redacted]w[redacted]d weeks gestation who presents to MAU reporting "thick, gooey, snot-like, sticky discharge" that is hanging out of the vagina. She reports some "contractions" since . She denies any recent SI or VB.   Past Medical History:  Diagnosis Date  . Chlamydia   . Pyelonephritis   . Sinus infection   . UTI (lower urinary tract infection)     Past Surgical History:  Procedure Laterality Date  . NO PAST SURGERIES      History reviewed. No pertinent family history.  Social History   Tobacco Use  . Smoking status: Never Smoker  . Smokeless tobacco: Never Used  Substance Use Topics  . Alcohol use: No  . Drug use: No    Allergies:  Allergies  Allergen Reactions  . Zofran [Ondansetron Hcl] Hives    Medications Prior to Admission  Medication Sig Dispense Refill Last Dose  . Cyanocobalamin (B-12 PO) Take 1 tablet by mouth daily.   Past Week at Unknown time  . doxylamine, Sleep, (UNISOM) 25 MG tablet Take 25 mg by mouth at bedtime as needed for sleep.   Past Week at Unknown time  . Prenatal Vit-Fe Fumarate-FA (PRENATAL MULTIVITAMIN) TABS tablet Take 1 tablet by mouth daily at 12 noon.   11/08/2017 at Unknown time  . promethazine (PHENERGAN) 25 MG tablet Take 1 tablet (25 mg total) by mouth every 6 (six) hours as needed for nausea or vomiting. 30 tablet 0    Review of Systems  Constitutional: Negative.   HENT: Negative.   Eyes: Negative.   Respiratory: Negative.   Cardiovascular: Negative.   Gastrointestinal: Positive for abdominal pain.  Endocrine: Negative.   Genitourinary: Positive for pelvic pain ("contractions") and vaginal discharge.  Musculoskeletal: Negative.   Skin: Negative.    Allergic/Immunologic: Negative.   Neurological: Negative.   Hematological: Negative.   Psychiatric/Behavioral: Negative.    Physical Exam   Blood pressure 102/63, pulse 84, temperature 98 F (36.7 C), temperature source Oral, resp. rate 20, height 5\' 4"  (1.626 m), weight 114 lb 12 oz (52.1 kg), last menstrual period 06/09/2017, SpO2 100 %.  Physical Exam  Nursing note and vitals reviewed. Constitutional: She appears well-developed and well-nourished.  HENT:  Head: Normocephalic.  Eyes: Pupils are equal, round, and reactive to light.  Neck: Normal range of motion.  Cardiovascular: Normal rate, regular rhythm and normal heart sounds.  Respiratory: Effort normal and breath sounds normal.  GI: Soft. Bowel sounds are normal.  Genitourinary:  Genitourinary Comments: Uterus: gravid, S=D, cx: smooth, pink, no lesions, copious amt of mucoid, clear-pink vaginal d/c -- ?membranes protruding from cervical os, no VE done d/t suspect of hourglassing membranes  Musculoskeletal: Normal range of motion.  Neurological: She is alert.  Skin: Skin is warm and dry.  Psychiatric: She has a normal mood and affect. Her behavior is normal. Judgment and thought content normal.    MAU Course  Procedures  MDM FHTs by doppler: 147 bpm Speculum exam -- hourglassing membranes into vagina, no digital exam performed Bedside OB Limited U/S to evaluate cervix, membrane and fetus  [place] Mfm Ob Limited  Result Date: 11/09/2017 ----------------------------------------------------------------------  OBSTETRICS REPORT                      (  Signed Final 11/09/2017 11:37 am) ---------------------------------------------------------------------- Patient Info  ID #:       [id number]                          D.O.B.:  1991/03/30 (26 yrs)  Name:       Hannah Fuller                   Visit Date: 11/09/2017 10:54 am ---------------------------------------------------------------------- Performed By  Performed By:     Lenise ArenaHannah Bazemore         Referred By:      MAU Nursing-                    RDMS                                     MAU/Triage  Attending:        Durwin NoraJeffrey M Denney       [facility]                    MD ---------------------------------------------------------------------- Orders   #  Description                                 Code   1  US MFM OB LIMITED                           [id number]  ----------------------------------------------------------------------   #  Ordered By               Order #        Accession #    Episode #   1  Raelyn MoraOLITTA Timber Lucarelli           [id number]      [id number]     [id number]  ---------------------------------------------------------------------- Indications   [redacted] weeks gestation of pregnancy                Z3A.20   Encounter for fetal anatomic survey            Z36.89   Vaginal discharge during pregnancy in          O26.892   second trimester  ---------------------------------------------------------------------- OB History  Blood Type:            Height:  5'4"   Weight (lb):  114       BMI:  19.57  Gravidity:    2         Prem:   1  Living:       1 ---------------------------------------------------------------------- Fetal Evaluation  Num Of Fetuses:     1  Fetal Heart         145  Rate(bpm):  Cardiac Activity:   Observed  Presentation:       Cephalic  Placenta:           Anterior, above cervical os  Amniotic Fluid  AFI FV:      Subjectively within normal limits                              Largest Pocket(cm)  5.77 ---------------------------------------------------------------------- Biometry  BPD:      48.8  mm     G. Age:  20w 5d         80  %                                                          FL/BPD:     65.4   %  FL:       31.9  mm     G. Age:  20w 0d         40  % ---------------------------------------------------------------------- Gestational Age  U/S Today:     20w 3d                                        EDD:   03/26/18  Best:          Cherylann Parr 0d      Det. By:  Previous Ultrasound      EDD:   03/29/18 ---------------------------------------------------------------------- Anatomy  Thoracic:              Appears normal         Stomach:                Appears normal, left                                                                        sided  Heart:                 Appears normal         Bladder:                Appears normal                         (4CH, axis, and situs ---------------------------------------------------------------------- Cervix Uterus Adnexa  Cervix  Hourglass membranes into the vagina. ---------------------------------------------------------------------- Impression  SIUP at [redacted]w[redacted]d (remote read only)  active fetus  cervix is open with hourglassing membranes into the vagina  no previa ---------------------------------------------------------------------- Recommendations  Recommend correlation with sterile speculum exam/physical  examination by on site OB provider. ----------------------------------------------------------------------               Durwin Nora, MD Electronically Signed Final Report   11/09/2017 11:37 am ----------------------------------------------------------------------   Assessment and Plan  Premature cervical dilation in second trimester - Admit to HROB unit - Place in Trendelenburg position - Admission orders being entered by Dr. Blane Ohara, MSN, CNM 11/09/2017, 10:40 AM

## 2017-11-09 NOTE — Anesthesia Postprocedure Evaluation (Signed)
Anesthesia Post Note  Patient: Clinical research associateChontia Mazurowski  Procedure(s) Performed: CERCLAGE CERVICAL (N/A Vagina )     Patient location during evaluation: PACU Anesthesia Type: Spinal Level of consciousness: awake Pain management: satisfactory to patient Vital Signs Assessment: post-procedure vital signs reviewed and stable Respiratory status: spontaneous breathing Cardiovascular status: blood pressure returned to baseline Postop Assessment: no headache and spinal receding Anesthetic complications: no    Last Vitals:  Vitals:   11/09/17 1700 11/09/17 1728  BP: (!) 87/66 (!) 91/52  Pulse: 81 74  Resp: 16 16  Temp: 36.6 C 36.9 C  SpO2: 100% 100%    Last Pain:  Vitals:   11/09/17 1738  TempSrc:   PainSc: 6    Pain Goal: Patients Stated Pain Goal: 4 (11/09/17 1738)               Cristela BlueJACKSON,Kesley Mullens EDWARD

## 2017-11-09 NOTE — MAU Note (Signed)
Pt presents with c/o ctxs and VB that began this morning.  Reports VB only with wiping.  Denies LOF.  Reports +FM.

## 2017-11-09 NOTE — H&P (Signed)
Hannah RaisinChontia Fuller is a 27 y.o. female presenting for mucoid bloody discharge  27 yo G2P0101 @ 20+0 presents for evaluation of mucoid bloody vaginal discharge.  The patient states that she noticed some crampy abdominal pain last evening but ultimately was able to fall asleep.  When she woke up this morning and went to the bathroom she noticed bloody, mucoid vaginal discharge with wiping.  This persisted for several wipes.  She came to maternity admissions for evaluation.  Upon speculum exam by the nurse midwife the cervix was visually dilated with membranes appearing to be prolapsing through the external cervical office.  The patient was sent for ultrasound.  Ultrasound demonstrated a active fetus with fetal cardiac activity.  The cervix was assessed abdominally.  The cervix was noted to be dilated with membranes appearing to be prolapsing through the dilated cervix hourglassing into the vagina.  The patient was admitted to antepartum placed in Trendelenburg.  Management options were discussed with the patient at length including no intervention.  The patient was offered emergency cerclage.  Risks/benefits/alternatives of no intervention versus emergency cerclage were discussed with the patient at length including but not limited to further damage to the cervix, bleeding, rupture of membranes, infection, acceleration of labor, fetal demise.  Patient wishes to proceed with cerclage  The patient was last seen in our office for her first prenatal visit at 13 weeks.  She has not been seen since that time.  She has a history of a prior 35-week preterm delivery.  She was offered 17 hydroxyprogesterone caproate injections at that time to initiate at 16 weeks, however she had not accepted to proceed with this. OB History    Gravida Para Term Preterm AB Living   2 1   1   1    SAB TAB Ectopic Multiple Live Births         0 1     Past Medical History:  Diagnosis Date  . Chlamydia   . Pyelonephritis   . Sinus  infection   . UTI (lower urinary tract infection)    Past Surgical History:  Procedure Laterality Date  . NO PAST SURGERIES     Family History: family history is not on file. Social History:  reports that  has never smoked. she has never used smokeless tobacco. She reports that she does not drink alcohol or use drugs.     Maternal Diabetes: too early Genetic Screening: Normal Maternal Ultrasounds/Referrals: Anatomy scan not yet performed, US findings from today as mentioned above Fetal Ultrasounds or other Referrals:  None Maternal Substance Abuse:  No Significant Maternal Medications:  None Significant Maternal Lab Results:  None Other Comments:  None  ROS History   Blood pressure 96/61, pulse 92, temperature 98.5 F (36.9 C), temperature source Oral, resp. rate 20, height 5\' 4"  (1.626 m), weight 52.1 kg (114 lb 12 oz), last menstrual period 06/09/2017, SpO2 100 %. Exam Physical Exam   AOX3, Anxious appearing Normal work of breathing  Abdomen soft US findings as mentioned above  Prenatal labs: ABO, Rh:  B pos Antibody:   Neg Rubella:  Imm RPR:   NR HBsAg:   Neg HIV:   NR GBS:     Assessment/Plan: 1) Admit 2) Bedrest in trendelenberg 3) Proceed with Emergency cerclage 4) Ancef 2gm OCTOR 5) SCDs 6) Indomethacin 50mg  PR then 25 mg po Q 6 hrs   Hannah Fuller 11/09/2017, 1:16 PM

## 2017-11-09 NOTE — Transfer of Care (Signed)
Immediate Anesthesia Transfer of Care Note  Patient: Hannah RaisinChontia Roan  Procedure(s) Performed: CERCLAGE CERVICAL (N/A Vagina )  Patient Location: PACU  Anesthesia Type:Spinal  Level of Consciousness: awake  Airway & Oxygen Therapy: Patient Spontanous Breathing  Post-op Assessment: Report given to RN  Post vital signs: Reviewed and stable  Last Vitals:  Vitals:   11/09/17 1231 11/09/17 1242  BP: 96/61   Pulse: 92   Resp: 20   Temp: 36.9 C   SpO2: 100% 100%    Last Pain:  Vitals:   11/09/17 1240  TempSrc:   PainSc: 2          Complications: No apparent anesthesia complications

## 2017-11-09 NOTE — MAU Note (Signed)
Pt is a G2P1 at 20.0 weeks c/o ctx and discharge.  Pt has h/o PTL and delivery at 35 weeks.  No current OB complications or concerns.

## 2017-11-09 NOTE — Anesthesia Preprocedure Evaluation (Signed)
Anesthesia Evaluation  Patient identified by MRN, date of birth, ID band Patient awake    Reviewed: Allergy & Precautions, H&P , Patient's Chart, lab work & pertinent test results  Airway Mallampati: II  TM Distance: >3 FB Neck ROM: full    Dental no notable dental hx.    Pulmonary    Pulmonary exam normal breath sounds clear to auscultation       Cardiovascular Exercise Tolerance: Good  Rhythm:regular Rate:Normal     Neuro/Psych    GI/Hepatic   Endo/Other    Renal/GU      Musculoskeletal   Abdominal   Peds  Hematology   Anesthesia Other Findings   Reproductive/Obstetrics                             Anesthesia Physical Anesthesia Plan  ASA: II and emergent  Anesthesia Plan: Spinal   Post-op Pain Management:    Induction:   PONV Risk Score and Plan:   Airway Management Planned:   Additional Equipment:   Intra-op Plan:   Post-operative Plan:   Informed Consent: I have reviewed the patients History and Physical, chart, labs and discussed the procedure including the risks, benefits and alternatives for the proposed anesthesia with the patient or authorized representative who has indicated his/her understanding and acceptance.     Plan Discussed with:   Anesthesia Plan Comments: (  )        Anesthesia Quick Evaluation

## 2017-11-09 NOTE — Op Note (Signed)
Operative Note  Pre-Operative Diagnosis: 1) 20+0-week intrauterine pregnancy 2) cervical incompetence Postoperative diagnoses: 1) 20+0-week intrauterine pregnancy 2) cervical incompetence Procedure: Emergency single McDonald cerclage Surgeon: Dr. Waynard ReedsKendra Manasseh Pittsley Asst.: None Operative findings: On speculum exam the patient was found to have bulging membranes into the vagina approximately 2 cm.  The cervix was 2-3 cm dilated on visual inspection.  Initially the membranes were under significant tension and would not reduce with steep Trendelenburg position.  Terbutaline was administered and a sterile Foley catheter with a 30 cc balloon was used to displace the bulging membranes superiorly. Specimen: None EBL: Minimal Procedure: Ms. Hannah Fuller is a 27 year old gravida 2 para 0101 at 20 weeks +0 days who presented for evaluation of mucoid vaginal discharge and was found to have hourglassing membranes into the vagina.  An emergency cerclage was recommended.  Risks/benefits/alternatives of the procedure were discussed with the patient at length.  The patient understood the risks of the procedure and wished to proceed.  The patient was taken to the operating room where spinal anesthesia was administered and found to be adequate.  She was placed in the dorsal lithotomy position in [place] stirrups.  She was appropriately identified in the preoperative timeout procedure.  The perineum was prepped and a Foley catheter was inserted under sterile technique.  I performed the sterile vaginal prep with Betadine.  A speculum was placed into the vagina for the above findings.  After the membranes would not reduce with steep Trendelenburg positioning, terbutaline was administered.  A Foley catheter with a 30 cc balloon was used to displace the bulging membrane superiorly.  Once the membranes were displaced and #5 Ethibond suture was used to make a pursestring stitch circumferentially around the cervix.  This was tied down at 1 to 2:00.   Prior to completely tying down the suture the Foley catheter balloon was removed from the cervix.  5 knots followed by a air not followed by an additional 5 knots were placed in the suture.  The speculum was removed from the vagina.  The patient was taken out of Trendelenburg.  This completed the operative procedure.  All sponge, lap, needle counts were correct.  The patient was transferred to the PACU in stable condition following the procedure.

## 2017-11-09 NOTE — Anesthesia Procedure Notes (Signed)
Anesthesia Regional Block: Narrative:       

## 2017-11-10 ENCOUNTER — Encounter (HOSPITAL_COMMUNITY): Payer: Self-pay | Admitting: Obstetrics and Gynecology

## 2017-11-10 ENCOUNTER — Inpatient Hospital Stay (HOSPITAL_COMMUNITY): Payer: BLUE CROSS/BLUE SHIELD

## 2017-11-10 DIAGNOSIS — O3432 Maternal care for cervical incompetence, second trimester: Secondary | ICD-10-CM | POA: Diagnosis present

## 2017-11-10 LAB — RPR: RPR Ser Ql: NONREACTIVE

## 2017-11-10 NOTE — Progress Notes (Signed)
Patient is eating, voiding.  Denies LOF vaginal bleeding or contractions. Reports some spotting after cerclage placement present on pad not change since yesterday evening.  Denies further bleeding since.  +FM  Vitals:   11/09/17 2324 11/10/17 0630 11/10/17 0830 11/10/17 1204  BP: (!) 91/56 (!) 88/54 92/61 (!) 90/51  Pulse: 81 72 68 77  Resp: 17 16 18 18   Temp: 98.7 F (37.1 C) 98.3 F (36.8 C) 98.2 F (36.8 C) 98.2 F (36.8 C)  TempSrc: Oral Oral Oral Oral  SpO2: 100% 100% 99% 97%  Weight:      Height:        Lab Results  Component Value Date   WBC 10.8 (H) 11/09/2017   HGB 9.9 (L) 11/09/2017   HCT 28.7 (L) 11/09/2017   MCV 97.0 11/09/2017   PLT 154 11/09/2017    --/--/B POS, B POS (01/08 1317)  A/P 20.1 s/p cerclage placement POD#1. Discussed plan with Dr. Tenny Crawoss, barring new sxs pt ok to d/c home today after anatomy scan. Anatomy scan report in - grossly normal female, suboptimally viewed spine0 recommend repeat scan 4 weeks Follow Dr. Tenny Crawoss' post op instructions and see her in office 1 week.  Precautions reviewed.   Philip AspenALLAHAN, Kollins Fenter

## 2017-11-10 NOTE — Discharge Summary (Addendum)
HD#2 POD #1 from cerclage for cervical insufficiency Pt denies any problems since cerclage placement and discussed discharge plans with Dr. Tenny Crawoss. Pt to f/u 1 week with Dr. Tenny Crawoss.  Prior to discharge pt reports blood in toilet when using urinating.  Per RN examined pt and no bleeding from vagina, on pad in underwear or on pad in bed.  Pt went to the bathroom again at a later time and stated no further bleeding noted.  I performed a speculum exam and fox swab x4 were used to evacuate thick blood tinged mucous.  No watery fluid or active bleeding noted.  Cervix appeared closed and no membranes were visualized.  Bleeding and PTL precautions were again reviewed.  Pt stated she discussed modified bedrest with Dr. Tenny Crawoss until next visit and if all was stable pt could return to working.  Advised to f/u as scheduled.

## 2017-11-10 NOTE — Addendum Note (Signed)
Addendum  created 11/10/17 1020 by Earmon PhoenixWilkerson, Lilyana Lippman P, CRNA   Sign clinical note

## 2017-11-10 NOTE — Anesthesia Postprocedure Evaluation (Signed)
Anesthesia Post Note  Patient: Hannah Fuller  Procedure(s) Performed: CERCLAGE CERVICAL (N/A Vagina )     Patient location during evaluation: Mother Baby Anesthesia Type: Spinal Level of consciousness: awake Pain management: pain level controlled Vital Signs Assessment: post-procedure vital signs reviewed and stable Respiratory status: spontaneous breathing Cardiovascular status: stable Postop Assessment: spinal receding and patient able to bend at knees Anesthetic complications: no    Last Vitals:  Vitals:   11/10/17 0630 11/10/17 0830  BP: (!) 88/54 92/61  Pulse: 72 68  Resp: 16 18  Temp: 36.8 C 36.8 C  SpO2: 100% 99%    Last Pain:  Vitals:   11/10/17 0911  TempSrc:   PainSc: Asleep   Pain Goal: Patients Stated Pain Goal: 4 (11/09/17 1830)               Edison PaceWILKERSON,Wreatha Sturgeon

## 2018-01-24 ENCOUNTER — Encounter (HOSPITAL_BASED_OUTPATIENT_CLINIC_OR_DEPARTMENT_OTHER): Payer: Self-pay | Admitting: Emergency Medicine

## 2018-01-24 ENCOUNTER — Emergency Department (HOSPITAL_BASED_OUTPATIENT_CLINIC_OR_DEPARTMENT_OTHER)
Admission: EM | Admit: 2018-01-24 | Discharge: 2018-01-24 | Disposition: A | Discharge status: Home / Self Care | Payer: BLUE CROSS/BLUE SHIELD | Attending: Emergency Medicine | Admitting: Emergency Medicine

## 2018-01-24 ENCOUNTER — Other Ambulatory Visit: Payer: Self-pay

## 2018-01-24 DIAGNOSIS — J0100 Acute maxillary sinusitis, unspecified: Secondary | ICD-10-CM | POA: Diagnosis not present

## 2018-01-24 DIAGNOSIS — Z79899 Other long term (current) drug therapy: Secondary | ICD-10-CM | POA: Insufficient documentation

## 2018-01-24 DIAGNOSIS — G501 Atypical facial pain: Secondary | ICD-10-CM | POA: Diagnosis present

## 2018-01-24 MED ORDER — AMOXICILLIN 500 MG PO CAPS: 500.0000 mg | ORAL_CAPSULE | Freq: Three times a day (TID) | ORAL | 0 refills | Status: DC

## 2018-01-24 MED ORDER — PREDNISONE 20 MG PO TABS: 20.0000 mg | ORAL_TABLET | Freq: Two times a day (BID) | ORAL | 0 refills | Status: AC

## 2018-01-24 MED FILL — predniSONE 20 MG TABS: 20 | 2 days supply | Qty: 4 | Fill #0

## 2018-01-24 MED FILL — AMOXICILLIN 500 MG CAPSULE: 500 | 7 days supply | Qty: 21 | Fill #0

## 2018-01-24 NOTE — ED Provider Notes (Signed)
[facility] EMERGENCY DEPARTMENT Provider Note   CSN: [id number] Arrival date & time: 01/24/18  1330     History   Chief Complaint Chief Complaint  Patient presents with  . Facial Pain    HPI Hannah Fuller is a 27 y.o. female.  Is "I think I have a sinus infection".  HPI: 27 year old female.  Right cheek pain radiating to the right jaw and ear for the last 4-5 days.  Fever yesterday to 101.  Taking antihistamine, Flonase, and Sudafed without relief.  History of bacterial sinusitis.  No change in hearing.  No sore throat.  No cough.  Past Medical History:  Diagnosis Date  . Chlamydia   . Medical history non-contributory   . Pyelonephritis   . Sinus infection   . UTI (lower urinary tract infection)     Patient Active Problem List   Diagnosis Date Noted  . Cervical cerclage suture present, second trimester 11/10/2017  . Premature cervical dilation in second trimester 11/09/2017  . History of preterm delivery, currently pregnant in second trimester 11/09/2017  . Cervical incompetence affecting management of mother, antepartum 11/09/2017  . Postpartum state 12/30/2014  . Indication for care in labor or delivery 12/29/2014  . Preterm contractions 12/29/2014    Past Surgical History:  Procedure Laterality Date  . CERVICAL CERCLAGE N/A 11/09/2017   Procedure: CERCLAGE CERVICAL;  Surgeon: Waynard Reedsoss, Kendra, MD;  Location: [facility];  Service: Gynecology;  Laterality: N/A;  . NO PAST SURGERIES       OB History    Gravida  2   Para  1   Term      Preterm  1   AB      Living  1     SAB      TAB      Ectopic      Multiple  0   Live Births  1            Home Medications    Prior to Admission medications   Medication Sig Start Date End Date Taking? Authorizing Provider  amoxicillin (AMOXIL) 500 MG capsule Take 1 capsule (500 mg total) by mouth 3 (three) times daily. 01/24/18   Rolland PorterJames, Delando Satter, MD  Cyanocobalamin (B-12 PO) Take 1 tablet by mouth daily.     [provider]  doxylamine, Sleep, (UNISOM) 25 MG tablet Take 25 mg by mouth at bedtime as needed for sleep.    [provider]  predniSONE (DELTASONE) 20 MG tablet Take 1 tablet (20 mg total) by mouth 2 (two) times daily with a meal for 2 days. 01/24/18 01/26/18  Rolland PorterJames, Daniesha Driver, MD  Prenatal Vit-Fe Fumarate-FA (PRENATAL MULTIVITAMIN) TABS tablet Take 1 tablet by mouth daily at 12 noon.    [provider]  promethazine (PHENERGAN) 25 MG tablet Take 1 tablet (25 mg total) by mouth every 6 (six) hours as needed for nausea or vomiting. 08/07/17   McDonald, Coral ElseMia A, PA-C    Family History History reviewed. No pertinent family history.  Social History Social History   Tobacco Use  . Smoking status: Never Smoker  . Smokeless tobacco: Never Used  Substance Use Topics  . Alcohol use: No  . Drug use: No     Allergies   Zofran [ondansetron hcl]   Review of Systems Review of Systems  Constitutional: Negative for appetite change, chills, diaphoresis, fatigue and fever.  HENT: Positive for rhinorrhea, sinus pressure and sinus pain. Negative for mouth sores, sore throat and trouble swallowing.  Eyes: Negative for visual disturbance.  Respiratory: Negative for cough, chest tightness, shortness of breath and wheezing.   Cardiovascular: Negative for chest pain.  Gastrointestinal: Negative for abdominal distention, abdominal pain, diarrhea, nausea and vomiting.  Endocrine: Negative for polydipsia, polyphagia and polyuria.  Genitourinary: Negative for dysuria, frequency and hematuria.  Musculoskeletal: Negative for gait problem.  Skin: Negative for color change, pallor and rash.  Neurological: Negative for dizziness, syncope, light-headedness and headaches.  Hematological: Does not bruise/bleed easily.  Psychiatric/Behavioral: Negative for behavioral problems and confusion.     Physical Exam Updated Vital Signs BP 107/73   Pulse 92   Temp 98.1 F (36.7 C) (Oral)    Resp 20   Ht 5\' 4"  (1.626 m)   Wt 60.3 kg (133 lb)   LMP 06/09/2017   SpO2 99%   BMI 22.83 kg/m   Physical Exam  Constitutional: She is oriented to person, place, and time. She appears well-developed and well-nourished. No distress.  HENT:  Head: Normocephalic.  Eyes: Pupils are equal, round, and reactive to light. Conjunctivae are normal. No scleral icterus.  Neck: Normal range of motion. Neck supple. No thyromegaly present.  Cardiovascular: Normal rate and regular rhythm. Exam reveals no gallop and no friction rub.  No murmur heard. Pulmonary/Chest: Effort normal and breath sounds normal. No respiratory distress. She has no wheezes. She has no rales.  Abdominal: Soft. Bowel sounds are normal. She exhibits no distension. There is no tenderness. There is no rebound.  Musculoskeletal: Normal range of motion.  Neurological: She is alert and oriented to person, place, and time.  Skin: Skin is warm and dry. No rash noted.  Psychiatric: She has a normal mood and affect. Her behavior is normal.     ED Treatments / Results  Labs (all labs ordered are listed, but only abnormal results are displayed) Labs Reviewed - No data to display  EKG None  Radiology No results found.  Procedures Procedures (including critical care time)  Medications Ordered in ED Medications - No data to display   Initial Impression / Assessment and Plan / ED Course  I have reviewed the triage vital signs and the nursing notes.  Pertinent labs & imaging results that were available during my care of the patient were reviewed by me and considered in my medical decision making (see chart for details).   Plan treatment for sinusitis. Continue OTC meds. Rx Amoxicillin  Final Clinical Impressions(s) / ED Diagnoses   Final diagnoses:  Acute maxillary sinusitis, recurrence not specified    ED Discharge Orders        Ordered    amoxicillin (AMOXIL) 500 MG capsule  3 times daily     01/24/18 1605     predniSONE (DELTASONE) 20 MG tablet  2 times daily with meals     01/24/18 1605       Rolland Porter, MD 01/24/18 1627

## 2018-01-24 NOTE — ED Triage Notes (Signed)
Patient reports sinus pain, headaches, congestion, sore throat since Thursday.  Reports taking claritin, sudafed, zyrtec without relief. Reports she was told by her on call doctor to take what she normally takes for sinuses.  Reports she took advil over the weekend.

## 2018-01-24 NOTE — Discharge Instructions (Addendum)
Continue over-the-counter medications.  Hold flonase, restart after completing prednisone

## 2018-01-24 NOTE — ED Triage Notes (Signed)
Patient reports feeling dizzy.  Placed in wheelchair.  Given water.  VS repeated.

## 2018-02-21 ENCOUNTER — Encounter (HOSPITAL_COMMUNITY): Payer: Self-pay

## 2018-02-21 ENCOUNTER — Inpatient Hospital Stay (HOSPITAL_COMMUNITY)
Admission: AD | Admit: 2018-02-21 | Discharge: 2018-02-21 | Disposition: A | Discharge status: Home / Self Care | Payer: BLUE CROSS/BLUE SHIELD | Source: Ambulatory Visit | Attending: Obstetrics | Admitting: Obstetrics

## 2018-02-21 DIAGNOSIS — O09892 Supervision of other high risk pregnancies, second trimester: Secondary | ICD-10-CM

## 2018-02-21 DIAGNOSIS — O4703 False labor before 37 completed weeks of gestation, third trimester: Secondary | ICD-10-CM

## 2018-02-21 DIAGNOSIS — Z8751 Personal history of pre-term labor: Secondary | ICD-10-CM | POA: Diagnosis not present

## 2018-02-21 DIAGNOSIS — Z3A34 34 weeks gestation of pregnancy: Secondary | ICD-10-CM | POA: Insufficient documentation

## 2018-02-21 DIAGNOSIS — Z8619 Personal history of other infectious and parasitic diseases: Secondary | ICD-10-CM | POA: Diagnosis not present

## 2018-02-21 DIAGNOSIS — Z888 Allergy status to other drugs, medicaments and biological substances status: Secondary | ICD-10-CM | POA: Diagnosis not present

## 2018-02-21 DIAGNOSIS — Z9889 Other specified postprocedural states: Secondary | ICD-10-CM | POA: Insufficient documentation

## 2018-02-21 DIAGNOSIS — R109 Unspecified abdominal pain: Secondary | ICD-10-CM | POA: Diagnosis present

## 2018-02-21 DIAGNOSIS — Z87440 Personal history of urinary (tract) infections: Secondary | ICD-10-CM | POA: Diagnosis not present

## 2018-02-21 DIAGNOSIS — O09212 Supervision of pregnancy with history of pre-term labor, second trimester: Secondary | ICD-10-CM

## 2018-02-21 LAB — URINALYSIS, ROUTINE W REFLEX MICROSCOPIC
BILIRUBIN URINE: NEGATIVE
Bacteria, UA: NONE SEEN
GLUCOSE, UA: NEGATIVE mg/dL
HGB URINE DIPSTICK: NEGATIVE
KETONES UR: 80 mg/dL — AB
LEUKOCYTES UA: NEGATIVE
Nitrite: NEGATIVE
PH: 8 (ref 5.0–8.0)
Protein, ur: 30 mg/dL — AB
Specific Gravity, Urine: 1.019 (ref 1.005–1.030)

## 2018-02-21 MED ORDER — LACTATED RINGERS IV BOLUS
1000.0000 mL | Freq: Once | INTRAVENOUS | Status: AC
  Administered 2018-02-21: 1000 mL via INTRAVENOUS

## 2018-02-21 NOTE — Discharge Instructions (Signed)

## 2018-02-21 NOTE — MAU Note (Signed)
Pt reports contractions that started at 10pm. States she took some tylenol and went to sleep and woke up to worsening contractions. Pt states they have been every 2-3 mins since 1am. Pt denies LOF or vaginal bleeding. Reports good fetal movement. States she does have a cerclage in place. Rates pain 6/10.

## 2018-02-21 NOTE — MAU Provider Note (Signed)
History     CSN: [id number]  Arrival date and time: 02/21/18 [id number]   First Provider Initiated Contact with Patient 02/21/18 0356     Chief Complaint  Patient presents with  . Contractions   HPI Hannah Fuller is a 27 y.o. G2P0101 at 1676w6d who presents with contractions. She states she they started at 2200 and around 0100 became more regular. She rates the contractions a 6/10 and tried tylenol with no relief. She states the contractions are every 2-3 minutes. She denies any vaginal bleeding or leaking of fluid. Reports good fetal movement.   She has a history of a preterm delivery at 35 weeks with her first child. With this pregnancy, she had a rescue cerclage placed at 20 weeks after being found to be 2-3cm dilated with bulging membranes. She reports no problems in the pregnancy since then.   OB History    Gravida  2   Para  1   Term      Preterm  1   AB      Living  1     SAB      TAB      Ectopic      Multiple  0   Live Births  1           Past Medical History:  Diagnosis Date  . Chlamydia   . Medical history non-contributory   . Pyelonephritis   . Sinus infection   . UTI (lower urinary tract infection)     Past Surgical History:  Procedure Laterality Date  . CERVICAL CERCLAGE N/A 11/09/2017   Procedure: CERCLAGE CERVICAL;  Surgeon: Waynard Reedsoss, Kendra, MD;  Location: [facility];  Service: Gynecology;  Laterality: N/A;  . NO PAST SURGERIES      No family history on file.  Social History   Tobacco Use  . Smoking status: Never Smoker  . Smokeless tobacco: Never Used  Substance Use Topics  . Alcohol use: No  . Drug use: No    Allergies:  Allergies  Allergen Reactions  . Zofran [Ondansetron Hcl] Hives    Medications Prior to Admission  Medication Sig Dispense Refill Last Dose  . amoxicillin (AMOXIL) 500 MG capsule Take 1 capsule (500 mg total) by mouth 3 (three) times daily. 21 capsule 0 Past Month at Unknown time  . Cyanocobalamin (B-12 PO) Take 1  tablet by mouth daily.   Past Week at Unknown time  . doxylamine, Sleep, (UNISOM) 25 MG tablet Take 25 mg by mouth at bedtime as needed for sleep.   Past Week at Unknown time  . Prenatal Vit-Fe Fumarate-FA (PRENATAL MULTIVITAMIN) TABS tablet Take 1 tablet by mouth daily at 12 noon.   02/20/2018 at Unknown time  . promethazine (PHENERGAN) 25 MG tablet Take 1 tablet (25 mg total) by mouth every 6 (six) hours as needed for nausea or vomiting. 30 tablet 0 Past Month at Unknown time    Review of Systems  Constitutional: Negative.  Negative for fatigue and fever.  HENT: Negative.   Respiratory: Negative.  Negative for shortness of breath.   Cardiovascular: Negative.  Negative for chest pain.  Gastrointestinal: Positive for abdominal pain. Negative for constipation, diarrhea, nausea and vomiting.  Genitourinary: Negative.  Negative for dysuria, vaginal bleeding and vaginal discharge.  Neurological: Negative.  Negative for dizziness and headaches.   Physical Exam   Blood pressure 118/62, pulse 88, temperature 98.8 F (37.1 C), temperature source Oral, resp. rate 18, height 5\' 4"  (1.626 m), weight 140  lb (63.5 kg), last menstrual period 06/09/2017, SpO2 100 %.  Physical Exam  Nursing note and vitals reviewed. Constitutional: She is oriented to person, place, and time. She appears well-developed and well-nourished. No distress.  HENT:  Head: Normocephalic.  Eyes: Pupils are equal, round, and reactive to light.  Cardiovascular: Normal rate, regular rhythm and normal heart sounds.  Respiratory: Effort normal and breath sounds normal. No respiratory distress.  GI: Soft. Bowel sounds are normal. She exhibits no distension. There is no tenderness.  Genitourinary:  Genitourinary Comments: Cerclage suture palpated on exam. No tension noted  Neurological: She is alert and oriented to person, place, and time.  Skin: Skin is warm and dry.  Psychiatric: She has a normal mood and affect. Her behavior is  normal. Judgment and thought content normal.   Dilation: Closed Effacement (%): Thick Cervical Position: Posterior Exam by:: Ma Hillock, CNM  Fetal Tracing:  Baseline: 130 Variability: moderate Accels: 15x15 Decels: none  Toco: 3-6  MAU Course  Procedures Results for orders placed or performed during the hospital encounter of 02/21/18 (from the past 24 hour(s))  Urinalysis, Routine w reflex microscopic     Status: Abnormal   Collection Time: 02/21/18  3:32 AM  Result Value Ref Range   Color, Urine YELLOW YELLOW   APPearance CLEAR CLEAR   Specific Gravity, Urine 1.019 1.005 - 1.030   pH 8.0 5.0 - 8.0   Glucose, UA NEGATIVE NEGATIVE mg/dL   Hgb urine dipstick NEGATIVE NEGATIVE   Bilirubin Urine NEGATIVE NEGATIVE   Ketones, ur 80 (A) NEGATIVE mg/dL   Protein, ur 30 (A) NEGATIVE mg/dL   Nitrite NEGATIVE NEGATIVE   Leukocytes, UA NEGATIVE NEGATIVE   RBC / HPF 0-5 0 - 5 RBC/hpf   WBC, UA 0-5 0 - 5 WBC/hpf   Bacteria, UA NONE SEEN NONE SEEN   Squamous Epithelial / LPF 0-5 (A) NONE SEEN   Mucus PRESENT    MDM UA LR bolus Contractions resolved after IV fluids. Patient sleeping. Consulted with Dr. Chestine Spore- will discharge patient home with labor precautions.   Assessment and Plan   1. Preterm uterine contractions in third trimester, antepartum   2. History of preterm delivery, currently pregnant in second trimester   3. [redacted] weeks gestation of pregnancy    -Discharge home in stable condition -Preterm labor precautions discussed -Patient advised to follow-up with [facility] as scheduled for prenatal care -Patient may return to MAU as needed or if her condition were to change or worsen  Rolm Bookbinder CNM 02/21/2018, 4:01 AM

## 2018-02-23 LAB — OB RESULTS CONSOLE GBS: GBS: NEGATIVE

## 2018-03-04 ENCOUNTER — Inpatient Hospital Stay (HOSPITAL_COMMUNITY)
Admission: AD | Admit: 2018-03-04 | Discharge: 2018-03-07 | DRG: 768 | Disposition: A | Discharge status: Home / Self Care | Payer: Medicaid Other | Source: Ambulatory Visit | Attending: Obstetrics and Gynecology | Admitting: Obstetrics and Gynecology

## 2018-03-04 DIAGNOSIS — Z3A36 36 weeks gestation of pregnancy: Secondary | ICD-10-CM

## 2018-03-04 DIAGNOSIS — D649 Anemia, unspecified: Secondary | ICD-10-CM | POA: Diagnosis present

## 2018-03-04 DIAGNOSIS — O09212 Supervision of pregnancy with history of pre-term labor, second trimester: Secondary | ICD-10-CM

## 2018-03-04 DIAGNOSIS — O9902 Anemia complicating childbirth: Secondary | ICD-10-CM | POA: Diagnosis present

## 2018-03-04 DIAGNOSIS — O3433 Maternal care for cervical incompetence, third trimester: Principal | ICD-10-CM | POA: Diagnosis present

## 2018-03-04 DIAGNOSIS — O09892 Supervision of other high risk pregnancies, second trimester: Secondary | ICD-10-CM

## 2018-03-04 NOTE — MAU Note (Signed)
Ctxs since 2000. Denies LOF or bleeding. Has cerclage which they were unable to remove. Was told to come in when started contracting and would remove then. First baby 35wks.

## 2018-03-05 ENCOUNTER — Inpatient Hospital Stay (HOSPITAL_COMMUNITY): Payer: Medicaid Other | Admitting: Anesthesiology

## 2018-03-05 ENCOUNTER — Encounter (HOSPITAL_COMMUNITY): Payer: Self-pay | Admitting: *Deleted

## 2018-03-05 ENCOUNTER — Other Ambulatory Visit: Payer: Self-pay

## 2018-03-05 DIAGNOSIS — O9902 Anemia complicating childbirth: Secondary | ICD-10-CM | POA: Diagnosis present

## 2018-03-05 DIAGNOSIS — D649 Anemia, unspecified: Secondary | ICD-10-CM | POA: Diagnosis present

## 2018-03-05 DIAGNOSIS — O3433 Maternal care for cervical incompetence, third trimester: Secondary | ICD-10-CM | POA: Diagnosis present

## 2018-03-05 DIAGNOSIS — Z3A36 36 weeks gestation of pregnancy: Secondary | ICD-10-CM | POA: Diagnosis not present

## 2018-03-05 LAB — TYPE AND SCREEN
ABO/RH(D): B POS
Antibody Screen: NEGATIVE

## 2018-03-05 LAB — URINALYSIS, ROUTINE W REFLEX MICROSCOPIC
Bilirubin Urine: NEGATIVE
Glucose, UA: NEGATIVE mg/dL
Hgb urine dipstick: NEGATIVE
KETONES UR: NEGATIVE mg/dL
LEUKOCYTES UA: NEGATIVE
Nitrite: NEGATIVE
PROTEIN: NEGATIVE mg/dL
Specific Gravity, Urine: 1.015 (ref 1.005–1.030)
pH: 6 (ref 5.0–8.0)

## 2018-03-05 LAB — CBC
HEMATOCRIT: 30.1 % — AB (ref 36.0–46.0)
HEMOGLOBIN: 10.1 g/dL — AB (ref 12.0–15.0)
MCH: 31.9 pg (ref 26.0–34.0)
MCHC: 33.6 g/dL (ref 30.0–36.0)
MCV: 95 fL (ref 78.0–100.0)
Platelets: 262 10*3/uL (ref 150–400)
RBC: 3.17 MIL/uL — ABNORMAL LOW (ref 3.87–5.11)
RDW: 13.1 % (ref 11.5–15.5)
WBC: 10.3 10*3/uL (ref 4.0–10.5)

## 2018-03-05 LAB — RPR: RPR Ser Ql: NONREACTIVE

## 2018-03-05 MED ORDER — TERBUTALINE SULFATE 1 MG/ML IJ SOLN
0.2500 mg | Freq: Once | INTRAMUSCULAR | Status: DC | PRN
  Filled 2018-03-05: qty 1

## 2018-03-05 MED ORDER — OXYTOCIN 40 UNITS IN LACTATED RINGERS INFUSION - SIMPLE MED: 2.5000 [IU]/h | INTRAVENOUS | Status: DC

## 2018-03-05 MED ORDER — OXYCODONE-ACETAMINOPHEN 5-325 MG PO TABS: 2.0000 | ORAL_TABLET | ORAL | Status: DC | PRN

## 2018-03-05 MED ORDER — ACETAMINOPHEN 325 MG PO TABS
650.0000 mg | ORAL_TABLET | ORAL | Status: DC | PRN
  Administered 2018-03-06: 650 mg via ORAL
  Filled 2018-03-05: qty 2

## 2018-03-05 MED ORDER — ONDANSETRON HCL 4 MG/2ML IJ SOLN: 4.0000 mg | Freq: Four times a day (QID) | INTRAMUSCULAR | Status: DC | PRN

## 2018-03-05 MED ORDER — COCONUT OIL OIL
1.0000 "application " | TOPICAL_OIL | Status: DC | PRN
  Administered 2018-03-07: 1 via TOPICAL
  Filled 2018-03-05: qty 120

## 2018-03-05 MED ORDER — METHYLERGONOVINE MALEATE 0.2 MG/ML IJ SOLN: 0.2000 mg | INTRAMUSCULAR | Status: DC | PRN

## 2018-03-05 MED ORDER — LACTATED RINGERS IV SOLN
INTRAVENOUS | Status: DC
  Administered 2018-03-05 (×3): via INTRAVENOUS

## 2018-03-05 MED ORDER — BENZOCAINE-MENTHOL 20-0.5 % EX AERO
1.0000 "application " | INHALATION_SPRAY | CUTANEOUS | Status: DC | PRN
  Administered 2018-03-05: 1 via TOPICAL
  Filled 2018-03-05: qty 56

## 2018-03-05 MED ORDER — DIBUCAINE 1 % RE OINT
1.0000 "application " | TOPICAL_OINTMENT | RECTAL | Status: DC | PRN
  Administered 2018-03-06: 1 via RECTAL
  Filled 2018-03-05: qty 28

## 2018-03-05 MED ORDER — LIDOCAINE HCL (PF) 1 % IJ SOLN
INTRAMUSCULAR | Status: DC | PRN
  Administered 2018-03-05: 5 mL via EPIDURAL
  Administered 2018-03-05: 2 mL via EPIDURAL
  Administered 2018-03-05: 3 mL via EPIDURAL

## 2018-03-05 MED ORDER — PROMETHAZINE HCL 25 MG/ML IJ SOLN
25.0000 mg | Freq: Four times a day (QID) | INTRAMUSCULAR | Status: DC | PRN
  Administered 2018-03-05: 12.5 mg via INTRAVENOUS
  Filled 2018-03-05: qty 1

## 2018-03-05 MED ORDER — LACTATED RINGERS IV SOLN: 500.0000 mL | Freq: Once | INTRAVENOUS | Status: DC

## 2018-03-05 MED ORDER — LIDOCAINE HCL (PF) 1 % IJ SOLN
30.0000 mL | INTRAMUSCULAR | Status: DC | PRN
  Filled 2018-03-05: qty 30

## 2018-03-05 MED ORDER — EPHEDRINE 5 MG/ML INJ
10.0000 mg | INTRAVENOUS | Status: DC | PRN
  Filled 2018-03-05: qty 2

## 2018-03-05 MED ORDER — ACETAMINOPHEN 325 MG PO TABS: 650.0000 mg | ORAL_TABLET | ORAL | Status: DC | PRN

## 2018-03-05 MED ORDER — PHENYLEPHRINE 40 MCG/ML (10ML) SYRINGE FOR IV PUSH (FOR BLOOD PRESSURE SUPPORT)
80.0000 ug | PREFILLED_SYRINGE | INTRAVENOUS | Status: DC | PRN
  Filled 2018-03-05: qty 10
  Filled 2018-03-05: qty 5

## 2018-03-05 MED ORDER — PHENYLEPHRINE 40 MCG/ML (10ML) SYRINGE FOR IV PUSH (FOR BLOOD PRESSURE SUPPORT)
80.0000 ug | PREFILLED_SYRINGE | INTRAVENOUS | Status: DC | PRN
  Filled 2018-03-05: qty 5

## 2018-03-05 MED ORDER — SOD CITRATE-CITRIC ACID 500-334 MG/5ML PO SOLN: 30.0000 mL | ORAL | Status: DC | PRN

## 2018-03-05 MED ORDER — SENNOSIDES-DOCUSATE SODIUM 8.6-50 MG PO TABS
2.0000 | ORAL_TABLET | ORAL | Status: DC
  Administered 2018-03-05 – 2018-03-06 (×2): 2 via ORAL
  Filled 2018-03-05 (×2): qty 2

## 2018-03-05 MED ORDER — DIPHENHYDRAMINE HCL 50 MG/ML IJ SOLN: 12.5000 mg | INTRAMUSCULAR | Status: DC | PRN

## 2018-03-05 MED ORDER — OXYTOCIN 40 UNITS IN LACTATED RINGERS INFUSION - SIMPLE MED
1.0000 m[IU]/min | INTRAVENOUS | Status: DC
  Administered 2018-03-05: 2 m[IU]/min via INTRAVENOUS
  Filled 2018-03-05: qty 1000

## 2018-03-05 MED ORDER — FLEET ENEMA 7-19 GM/118ML RE ENEM: 1.0000 | ENEMA | RECTAL | Status: DC | PRN

## 2018-03-05 MED ORDER — LACTATED RINGERS IV SOLN: 500.0000 mL | Freq: Once | INTRAVENOUS | Status: AC

## 2018-03-05 MED ORDER — FENTANYL 2.5 MCG/ML BUPIVACAINE 1/10 % EPIDURAL INFUSION (WH - ANES)
14.0000 mL/h | INTRAMUSCULAR | Status: DC | PRN
  Administered 2018-03-05: 14 mL/h via EPIDURAL
  Filled 2018-03-05 (×2): qty 100

## 2018-03-05 MED ORDER — IBUPROFEN 600 MG PO TABS
600.0000 mg | ORAL_TABLET | Freq: Four times a day (QID) | ORAL | Status: DC
  Administered 2018-03-05 – 2018-03-07 (×7): 600 mg via ORAL
  Filled 2018-03-05 (×8): qty 1

## 2018-03-05 MED ORDER — METHYLERGONOVINE MALEATE 0.2 MG PO TABS: 0.2000 mg | ORAL_TABLET | ORAL | Status: DC | PRN

## 2018-03-05 MED ORDER — PRENATAL MULTIVITAMIN CH
1.0000 | ORAL_TABLET | Freq: Every day | ORAL | Status: DC
  Administered 2018-03-06 – 2018-03-07 (×2): 1 via ORAL
  Filled 2018-03-05 (×2): qty 1

## 2018-03-05 MED ORDER — BETAMETHASONE SOD PHOS & ACET 6 (3-3) MG/ML IJ SUSP
12.0000 mg | Freq: Once | INTRAMUSCULAR | Status: AC
  Administered 2018-03-05: 12 mg via INTRAMUSCULAR
  Filled 2018-03-05: qty 2

## 2018-03-05 MED ORDER — FENTANYL 2.5 MCG/ML BUPIVACAINE 1/10 % EPIDURAL INFUSION (WH - ANES)
INTRAMUSCULAR | Status: DC | PRN
  Administered 2018-03-05: 14 mL/h via EPIDURAL

## 2018-03-05 MED ORDER — OXYTOCIN BOLUS FROM INFUSION
500.0000 mL | Freq: Once | INTRAVENOUS | Status: AC
  Administered 2018-03-05: 500 mL via INTRAVENOUS

## 2018-03-05 MED ORDER — VITAMIN K1 1 MG/0.5ML IJ SOLN
INTRAMUSCULAR | Status: AC
  Filled 2018-03-05: qty 0.5

## 2018-03-05 MED ORDER — WITCH HAZEL-GLYCERIN EX PADS
1.0000 "application " | MEDICATED_PAD | CUTANEOUS | Status: DC | PRN
  Administered 2018-03-06: 1 via TOPICAL

## 2018-03-05 MED ORDER — OXYCODONE-ACETAMINOPHEN 5-325 MG PO TABS: 1.0000 | ORAL_TABLET | ORAL | Status: DC | PRN

## 2018-03-05 MED ORDER — DIPHENHYDRAMINE HCL 25 MG PO CAPS: 25.0000 mg | ORAL_CAPSULE | Freq: Four times a day (QID) | ORAL | Status: DC | PRN

## 2018-03-05 MED ORDER — LACTATED RINGERS IV SOLN
500.0000 mL | INTRAVENOUS | Status: DC | PRN
  Administered 2018-03-05 (×2): 500 mL via INTRAVENOUS
  Administered 2018-03-05: 1000 mL via INTRAVENOUS

## 2018-03-05 MED ORDER — OXYCODONE-ACETAMINOPHEN 5-325 MG PO TABS
1.0000 | ORAL_TABLET | ORAL | Status: DC | PRN
  Administered 2018-03-06 – 2018-03-07 (×4): 1 via ORAL
  Filled 2018-03-05 (×4): qty 1

## 2018-03-05 MED ORDER — SIMETHICONE 80 MG PO CHEW: 80.0000 mg | CHEWABLE_TABLET | ORAL | Status: DC | PRN

## 2018-03-05 MED ORDER — ZOLPIDEM TARTRATE 5 MG PO TABS: 5.0000 mg | ORAL_TABLET | Freq: Every evening | ORAL | Status: DC | PRN

## 2018-03-05 MED ORDER — TETANUS-DIPHTH-ACELL PERTUSSIS 5-2.5-18.5 LF-MCG/0.5 IM SUSP: 0.5000 mL | Freq: Once | INTRAMUSCULAR | Status: DC

## 2018-03-05 NOTE — MAU Note (Signed)
PT  SAYS STARTED FEELING  UC'S  AT 8PM-    HAS CIRCLAGE .  DR TRIED TO CLIP CIRCLAGE  2 WEEKS AGO. Marland Kitchen    DENIES HSV AND MRSA.  GBS-  UNSURE

## 2018-03-05 NOTE — H&P (Signed)
Hannah Fuller is a 27 y.o. female presenting for labor  27 yo G2P0101 @ 36+6 presents for labor. The patient's pregnancy has been complicated by cervical incompetance. She underwent a single McDonald rescue cerclage at 20 weeks. She received 17P injections until 35 weeks for a history of prior preterm delivery at 36 weeks with her first pregnancy. An attempt to remove her cerclage was made at 35 weeks however it could not be removed. So the plan was to remove the cerclage when the patient came in in labor after epidural placement OB History    Gravida  2   Para  1   Term      Preterm  1   AB      Living  1     SAB      TAB      Ectopic      Multiple  0   Live Births  1          Past Medical History:  Diagnosis Date  . Chlamydia   . Medical history non-contributory   . Pyelonephritis   . Sinus infection   . UTI (lower urinary tract infection)    Past Surgical History:  Procedure Laterality Date  . CERVICAL CERCLAGE N/A 11/09/2017   Procedure: CERCLAGE CERVICAL;  Surgeon: Waynard Reeds, MD;  Location: [facility];  Service: Gynecology;  Laterality: N/A;  . NO PAST SURGERIES     Family History: family history is not on file. Social History:  reports that she has never smoked. She has never used smokeless tobacco. She reports that she does not drink alcohol or use drugs.     Maternal Diabetes: No Genetic Screening: Normal Maternal Ultrasounds/Referrals: Abnormal:  Findings:   Other: cervical shortening Fetal Ultrasounds or other Referrals:  None Maternal Substance Abuse:  No Significant Maternal Medications:  None Significant Maternal Lab Results:  None Other Comments:  None  ROS History Dilation: 10 Effacement (%): 100 Station: Plus 2, Plus 3 Exam by:: AWallace Cullens RN  Blood pressure 105/72, pulse 93, temperature 98.1 F (36.7 C), temperature source Oral, resp. rate 18, height  (1.626 m), weight 64 kg (141 lb), last menstrual period 06/09/2017, SpO2 100  %. Exam Physical Exam  Prenatal labs: ABO, Rh: --/--/B POS (05/04 0049) Antibody: NEG (05/04 0049) Rubella: Immune (11/13 0000) RPR: Non Reactive (01/08 1317)  HBsAg: Negative (11/13 0000)  HIV: Non-reactive (11/13 0000)  GBS: Negative (04/24 0000)   Assessment/Plan: 1) Admit 2) Epidural  3) Proceed with cerclage removal   Waynard Reeds 03/05/2018, 1:35 PM

## 2018-03-05 NOTE — Anesthesia Pain Management Evaluation Note (Signed)
  CRNA Pain Management Visit Note  Patient: Hannah Fuller, 27 y.o., female  "Hello I am a member of the anesthesia team at [facility]. We have an anesthesia team available at all times to provide care throughout the hospital, including epidural management and anesthesia for C-section. I don't know your plan for the delivery whether it a natural birth, water birth, IV sedation, nitrous supplementation, doula or epidural, but we want to meet your pain goals."   1.Was your pain managed to your expectations on prior hospitalizations?   Yes   2.What is your expectation for pain management during this hospitalization?     Epidural  3.How can we help you reach that goal? Maintain epidural until delivery.  Record the patient's initial score and the patient's pain goal.   Pain: 0  Pain Goal: 3 [facility] wants you to be able to say your pain was always managed very well.  Moustapha Tooker 03/05/2018

## 2018-03-05 NOTE — Anesthesia Postprocedure Evaluation (Signed)
Anesthesia Post Note  Patient: Clinical research associate  Procedure(s) Performed: AN AD HOC LABOR EPIDURAL     Patient location during evaluation: Mother Baby Anesthesia Type: Epidural Level of consciousness: awake and alert, oriented and patient cooperative Pain management: pain level controlled Vital Signs Assessment: post-procedure vital signs reviewed and stable Respiratory status: spontaneous breathing Cardiovascular status: stable Postop Assessment: no headache, epidural receding, patient able to bend at knees and no signs of nausea or vomiting Anesthetic complications: no Comments: Pain score 0.    Last Vitals:  Vitals:   03/05/18 1500 03/05/18 1620  BP: 110/68 110/69  Pulse: 74 72  Resp: 17 18  Temp: 37 C 36.9 C  SpO2: 99% 100%    Last Pain:  Vitals:   03/05/18 1620  TempSrc: Oral  PainSc:    Pain Goal: Patients Stated Pain Goal: 0 (03/04/18 2351)               Merrilyn Puma

## 2018-03-05 NOTE — Anesthesia Preprocedure Evaluation (Signed)
Anesthesia Evaluation  Patient identified by MRN, date of birth, ID band Patient awake    Reviewed: Allergy & Precautions, NPO status , Patient's Chart, lab work & pertinent test results  Airway Mallampati: II  TM Distance: >3 FB Neck ROM: Full    Dental  (+) Teeth Intact, Dental Advisory Given   Pulmonary neg pulmonary ROS,    Pulmonary exam normal breath sounds clear to auscultation       Cardiovascular Exercise Tolerance: Good negative cardio ROS Normal cardiovascular exam Rhythm:Regular Rate:Normal     Neuro/Psych negative neurological ROS     GI/Hepatic negative GI ROS, Neg liver ROS,   Endo/Other  negative endocrine ROS  Renal/GU negative Renal ROS     Musculoskeletal negative musculoskeletal ROS (+)   Abdominal   Peds  Hematology  (+) Blood dyscrasia, anemia , Plt 262k   Anesthesia Other Findings Day of surgery medications reviewed with the patient.  Reproductive/Obstetrics (+) Pregnancy                             Anesthesia Physical Anesthesia Plan  ASA: II  Anesthesia Plan: Epidural   Post-op Pain Management:    Induction:   PONV Risk Score and Plan: 2 and Treatment may vary due to age or medical condition  Airway Management Planned:   Additional Equipment:   Intra-op Plan:   Post-operative Plan:   Informed Consent: I have reviewed the patients History and Physical, chart, labs and discussed the procedure including the risks, benefits and alternatives for the proposed anesthesia with the patient or authorized representative who has indicated his/her understanding and acceptance.   Dental advisory given  Plan Discussed with:   Anesthesia Plan Comments: (Patient identified. Risks/Benefits/Options discussed with patient including but not limited to bleeding, infection, nerve damage, paralysis, failed block, incomplete pain control, headache, blood pressure  changes, nausea, vomiting, reactions to medication both or allergic, itching and postpartum back pain. Confirmed with bedside nurse the patient's most recent platelet count. Confirmed with patient that they are not currently taking any anticoagulation, have any bleeding history or any family history of bleeding disorders. Patient expressed understanding and wished to proceed. All questions were answered. )        Anesthesia Quick Evaluation

## 2018-03-05 NOTE — Lactation Note (Signed)
This note was copied from a baby's chart. Lactation Consultation Note  Patient Name: Hannah Fuller ZOXWR'U Date: 03/05/2018 Reason for consult: Initial assessment;Late-preterm 34-36.6wks  This is a G2P2 mother whose infant is now 39 hours old.  Infant is a LPTI and is 6+0.7 lbs.  Mother stated she just finished breastfeeding before I arrived.  She said the baby fed well and she was sleeping in mother's arms.  I reviewed the LPTI policy with mother and the importance of following it.  Mother stated that her first baby was 35 weeks and she only breastfed her.  She does not want to use any supplement as protocol states at this time.    Reviewed breast massage, hand expression, awakening infant every 3 hours for feeding, keeping stimulation to a minimum to let infant rest between feeds and how to awaken a sleepy baby.  Since mother is refusing supplementation I set her up with a DEBP to stimulate breasts and to try to obtain colostrum for supplementation.  Mother very interested in pumping and will keep this up throughout the night.  Demonstrated pump and how to put pump parts  together.  Mother did a return demonstration and cleaning of pump parts discussed with mother.  Milk storage times reviewed and plastic container provided for any EBM she may obtain.  Mother will spoon feed any EBM back to infant.    Mother does not have WIC and already has a DEBP from her insurance company.  Mom made aware of O/P services, breastfeeding support groups, community resources, and our phone # for post-discharge questions.  Maternal Data Formula Feeding for Exclusion: No Has patient been taught Hand Expression?: Yes Does the patient have breastfeeding experience prior to this delivery?: Yes  Feeding    LATCH Score                   Interventions    Lactation Tools Discussed/Used Tools: Pump WIC Program: No Pump Review: Setup, frequency, and cleaning;Milk Storage Initiated by:: Shawne Eskelson Date initiated:: 03/05/18   Consult Status Consult Status: Follow-up Date: 03/06/18 Follow-up type: In-patient    Nicoletta Hush R Sammie Schermerhorn 03/05/2018, 10:11 PM

## 2018-03-05 NOTE — Anesthesia Procedure Notes (Signed)
Epidural Patient location during procedure: OB Start time: 03/05/2018 1:26 AM End time: 03/05/2018 1:31 AM  Staffing Anesthesiologist: Cecile Hearing, MD Performed: anesthesiologist   Preanesthetic Checklist Completed: patient identified, pre-op evaluation, timeout performed, IV checked, risks and benefits discussed and monitors and equipment checked  Epidural Patient position: sitting Prep: DuraPrep Patient monitoring: blood pressure and continuous pulse ox Approach: midline Location: L3-L4 Injection technique: LOR air  Needle:  Needle type: Tuohy  Needle gauge: 17 G Needle length: 9 cm Needle insertion depth: 5 cm Catheter size: 19 Gauge Catheter at skin depth: 10 cm Test dose: negative and Other (1% Lidocaine)  Additional Notes Patient identified.  Risk benefits discussed including failed block, incomplete pain control, headache, nerve damage, paralysis, blood pressure changes, nausea, vomiting, reactions to medication both toxic or allergic, and postpartum back pain.  Patient expressed understanding and wished to proceed.  All questions were answered.  Sterile technique used throughout procedure and epidural site dressed with sterile barrier dressing. No paresthesia or other complications noted. The patient did not experience any signs of intravascular injection such as tinnitus or metallic taste in mouth nor signs of intrathecal spread such as rapid motor block. Please see nursing notes for vital signs. Reason for block:procedure for pain

## 2018-03-06 LAB — CBC
HCT: 27.6 % — ABNORMAL LOW (ref 36.0–46.0)
Hemoglobin: 9.3 g/dL — ABNORMAL LOW (ref 12.0–15.0)
MCH: 32 pg (ref 26.0–34.0)
MCHC: 33.7 g/dL (ref 30.0–36.0)
MCV: 94.8 fL (ref 78.0–100.0)
PLATELETS: 241 10*3/uL (ref 150–400)
RBC: 2.91 MIL/uL — AB (ref 3.87–5.11)
RDW: 13.3 % (ref 11.5–15.5)
WBC: 16.9 10*3/uL — AB (ref 4.0–10.5)

## 2018-03-06 NOTE — Lactation Note (Signed)
This note was copied from a baby's chart. Lactation Consultation Note  Patient Name: Hannah Fuller Date: 03/06/2018   When I entered room, infant was at breast. Suck: swallow ratio was 8-10 sucks/swallow. I explained to Mom that we needed to supplement w/feedings (at that point infant's chart had not been updated). Mom was amenable to supplementing at breast, but then infant finished feeding. Supplement given via cup/yellow slow-flow nipple.    Mom's milk is coming to volume. She has not pumped since last night, so I encouraged her to pump. Mom was changed to size 21 flanges for pumping. Hand expression was taught to Mom.   Infant does not nipple feed very well (Mom says it takes infant 15 min to drink 7mL with the yellow slow-flow nipple). The slow flow nipple was changed to green & infant was placed in side-lying inclined with jaw support. A very small amount was consumed, but infant had had largest feed shortly before. Will continue to monitor.  I provided Mom with LPI volume parameters based on day of life. Mom encouraged to pump q3hrs to prevent engorgement.   Lurline Hare [facility] 03/06/2018, 9:08 PM

## 2018-03-06 NOTE — Progress Notes (Signed)
Post Partum Day 1 Subjective: no complaints, up ad lib, voiding, tolerating PO and + flatus  Objective: Blood pressure 111/66, pulse 68, temperature 98.6 F (37 C), temperature source Oral, resp. rate 20, height  (1.626 m), weight 64 kg (141 lb), last menstrual period 06/09/2017, SpO2 100 %, unknown if currently breastfeeding.  Physical Exam:  General: alert, cooperative and appears stated age Lochia: appropriate Uterine Fundus: firm   Recent Labs    03/05/18 0049 03/06/18 0525  HGB 10.1* 9.3*  HCT 30.1* 27.6*    Assessment/Plan: Plan for discharge tomorrow   LOS: 1 day   Waynard Reeds 03/06/2018, 12:01 PM

## 2018-03-07 ENCOUNTER — Ambulatory Visit: Payer: Self-pay

## 2018-03-07 MED ORDER — POLYETHYLENE GLYCOL 3350 17 G PO PACK: 17.0000 g | PACK | Freq: Every day | ORAL | 0 refills | Status: DC | PRN

## 2018-03-07 MED ORDER — METOCLOPRAMIDE HCL 10 MG PO TABS
10.0000 mg | ORAL_TABLET | Freq: Once | ORAL | Status: DC
  Filled 2018-03-07: qty 1

## 2018-03-07 MED ORDER — ACETAMINOPHEN 325 MG PO TABS: 650.0000 mg | ORAL_TABLET | Freq: Four times a day (QID) | ORAL | 0 refills | Status: DC | PRN

## 2018-03-07 MED ORDER — OXYCODONE HCL 5 MG PO TABS: 5.0000 mg | ORAL_TABLET | Freq: Four times a day (QID) | ORAL | 0 refills | Status: DC | PRN

## 2018-03-07 MED ORDER — IBUPROFEN 600 MG PO TABS: 600.0000 mg | ORAL_TABLET | Freq: Four times a day (QID) | ORAL | 0 refills | Status: DC

## 2018-03-07 NOTE — Progress Notes (Signed)
Patient is doing well.  She is ambulating, voiding, tolerating PO.  Pain control is good.  Lochia is appropriate  Vitals:   03/06/18 0510 03/06/18 1810 03/06/18 2304 03/07/18 0510  BP: 111/66 107/66 98/66 113/81  Pulse: 68 87 77 73  Resp: Temp: 98.6 F (37 C) 98.2 F (36.8 C) 98.2 F (36.8 C) 98.5 F (36.9 C)  TempSrc: Oral Oral Oral Oral  SpO2:   99% 100%  Weight:      Height:        NAD Fundus firm Ext: no edema  Lab Results  Component Value Date   WBC 16.9 (H) 03/06/2018   HGB 9.3 (L) 03/06/2018   HCT 27.6 (L) 03/06/2018   MCV 94.8 03/06/2018   PLT 241 03/06/2018    --/--/B POS (05/04 0049)/RImmune  A/P 27 y.o. [id number] PPD#2. Routine care.   Meeting all goals--d/c to home today History PPD w G1--reports better support system at home at this time.  Reviewed resources available.    [facility] GEFFEL [employer]

## 2018-03-07 NOTE — Lactation Note (Signed)
This note was copied from a baby's chart. Lactation Consultation Note  Patient Name: Girl Marshea Wisher ZOXWR'U Date: 03/07/2018 Reason for consult: Late-preterm 34-36.6wks  Mom is very full/has some mild engorgement in certain aspects of her breasts. After pumping & feeding, I helped to place Mom in shells (to promote leakage) with a makeshift bra. Ice pack applied to top of breasts per maternal request. Mom to wear for 15 minutes.   Mom encouraged to put infant to breast & pump for comfort as needed. Mom already has EBM stored in refrigerator.   Lurline Hare [facility] 03/07/2018, 10:56 PM

## 2018-03-07 NOTE — Progress Notes (Signed)
Kim with Lactation informed RN that patient informed her that she had severe postpartum depression with her first child. RN is placing a social work consult for the patient.

## 2018-03-07 NOTE — Lactation Note (Signed)
This note was copied from a baby's chart. Lactation Consultation Note Mom called out d/t milk coming in. Breast full w/knots. Mom in pain d/t full breast. Encouraged massage while pumping and feeding. Encouraged to pump every 2-3 hrs to soften breast and BF.  Mom is latching baby. Baby BF well. Encouraged to wear tight bra, apply ICE and pump after feeding. Mom has DEBP at bedside pumping.  Patient Name: Hannah Fuller [id number] Date: 03/07/2018 Reason for consult: Follow-up assessment   Maternal Data    Feeding Feeding Type: Breast Fed Nipple Type: Regular Length of feed: 20 min  LATCH Score             Hold (Positioning): Full assist, staff holds infant at breast     Interventions Interventions: DEBP  Lactation Tools Discussed/Used Tools: Shells;Pump Shell Type: Inverted Breast pump type: Double-Electric Breast Pump   Consult Status Consult Status: Follow-up Date: 03/07/18 Follow-up type: In-patient    Charyl Dancer 03/07/2018, 7:16 AM

## 2018-03-07 NOTE — Discharge Summary (Signed)
Obstetric Discharge Summary Reason for Admission: onset of labor Prenatal Procedures: none Intrapartum Procedures: spontaneous vaginal delivery and removal of cerclage Postpartum Procedures: none Complications-Operative and Postpartum: none Hemoglobin  Date Value Ref Range Status  03/06/2018 9.3 (L) 12.0 - 15.0 g/dL Final   HCT  Date Value Ref Range Status  03/06/2018 27.6 (L) 36.0 - 46.0 % Final    Physical Exam:  General: alert, cooperative and appears stated age Lochia: appropriate Uterine Fundus: firm DVT Evaluation: No evidence of DVT seen on physical exam. Negative Homan's sign.  Discharge Diagnoses: Premature labor at 36 weeks  Discharge Information: Date: 03/07/2018 Activity: pelvic rest Diet: routine Medications: PNV, Ibuprofen and oxycodone Condition: stable Instructions: refer to practice specific booklet Discharge to: home Follow-up Information    Waynard Reeds, MD Follow up in 4 week(s).   Specialty:  Obstetrics and Gynecology Contact information: [address] [place] [address] [phone]           Newborn Data: Live born female  Birth Weight: 6 lb 0.7 oz (2740 g) APGAR: 9, 9  Newborn Delivery   Birth date/time:  03/05/2018 13:14:00 Delivery type:  Vaginal, Spontaneous     Home with mother.  Gwyn Mehring GEFFEL Kollyn Lingafelter 03/07/2018, 9:29 AM

## 2018-03-07 NOTE — Lactation Note (Signed)
This note was copied from a baby's chart. Lactation Consultation Note  Patient Name: Hannah Fuller [id number]'U Date: 03/07/2018 Reason for consult: Late-preterm 34-36.6wks  RN called saying Mom was requesting lactation. Mom's milk has come to volume and she is uncomfortable. Mom reports that she has been pumping q2-3hrs., but only obtaining 20ml total/per session. Mom was helped with using the initiate setting and the size 21 flanges and Mom was able to double her yield (Mom had last pumped around 7pm).   Mom reports that infant has "a lot of swallows" when at the breast. Infant was observed going to the breast with ease & swallows were constant & frequent (swallows verified by cervical auscultation with 1-2 sucks:swallow).   Lurline Hare [facility] 03/07/2018, 9:18 PM

## 2018-03-07 NOTE — Progress Notes (Signed)
CSW received consult for hx of PPD per report from lactation consultant.  CSW met with MOB to offer support and complete assessment.  MOB was quiet, but pleasant and receptive to CSW's visit.  She was pumping and nursing when CSW arrived and stated this was a good time to talk with her.  She states she is tired and ready to go home today.   MOB reports that labor and delivery were perfect.  She states complications leading to an emergency cerclage, which was very scary.  CSW validated her feelings and offered support as she spoke about this experience.  She states she felt better after she prayed about it and that she felt well supported by her doctor.  She states she feels well emotionally at this time with no concerns moving in to the postpartum time period.   MOB reports that "I cried a lot" after my first daughter was born, but denies any symptoms that interfered with daily life or her ability to enjoy her baby.  MOB reports having a good support system through her fiance (father to both daughters), her mother (here from [place]), her sister (here from [place]) and FOB's mother.  MOB scored a zero on the Edinburgh Postnatal Depression Scale.   CSW provided education regarding the baby blues period vs. perinatal mood disorders, and informed MOB of "Mom Talk" support group held at [facility].  CSW recommends self-evaluation during the postpartum time period using the Edinburgh Postnatal Depression Scale as well as the New Mom Checklist from Postpartum Progress, which CSW provided, and encouraged MOB to contact a medical professional if symptoms are noted at any time.   CSW provided review of Sudden Infant Death Syndrome (SIDS) precautions.   CSW identifies no further need for intervention and no barriers to discharge at this time. CSW was present when pediatrician came to assess the baby and remind MOB that baby will not be discharging today since she was born at 36.6 weeks.  MOB seemed  disappointment and stated that "I'm not happy about it," but was understanding.  When pediatrician left the room, MOB commented, "but she's doing so good."  CSW celebrated this with MOB, and acknowledged that we would not want it any other way, but want to ensure that she continues "doing so good."  MOB agreed.  MOB states that her sister will be coming back from [place] today and can stay with her and that her daughter is home with her fiance, so there are no barriers to her remaining in the hospital with baby another night.  

## 2018-03-07 NOTE — Lactation Note (Signed)
This note was copied from a baby's chart. Lactation Consultation Note  Patient Name: Hannah Fuller [id number] Date: 03/07/2018 Reason for consult: Follow-up assessment  Visited with P2 Mom on day of discharge.  Baby 4 hrs old, and at 5.6% weight loss.  Mom has been breastfeeding (baby notably sleepy on the breast), and then supplementing.  Baby has been supplemented with formula, but today Mom's breasts are filling, and using more EBM.  Breasts remain compressible, but much fuller today.  Mom resting in bed, very tired, with baby on her chest.  Assisted with double pumping, and encouraged Mom to pump regularly to support a full milk supply.   During pumping, baby was cueing to eat.  Stopped pumping and assisted with latch in football hold on right breast.  Baby opens widely.  Mom needing guidance with holding and supporting baby's head, and supporting and sandwiching breast to accommodate baby's mouth.  Baby sleepy, but sucking and swallows heard and identified for mom.  Demonstrated alternate breast compression to increase milk flow.  Mom has an [place] from her insurance.  Showed Mom how to assemble parts of kit.  Engorgement prevention and treatment discussed.  Plan- 1- Offer breast at least every 3 hrs.  Make sure baby is latched deeply 2- Pump both breasts for 15-20 mins, adding breast massage and hand expression  3- offer baby 10-20 ml after baby breastfeeds, increasing each day as tolerated. 4- Follow-up with Lactation (either at [facility], or here at [facility])  Mom has phone number, declined me referring her as she prefers to see [place] at [facility] office. 5- Call for concerns.   Consult Status Consult Status: Complete Date: 03/07/18 Follow-up type: Call as needed    Broadus John 03/07/2018, 8:26 AM

## 2018-03-30 ENCOUNTER — Other Ambulatory Visit: Payer: Self-pay

## 2018-03-30 ENCOUNTER — Emergency Department (HOSPITAL_BASED_OUTPATIENT_CLINIC_OR_DEPARTMENT_OTHER): Payer: Medicaid Other

## 2018-03-30 ENCOUNTER — Encounter (HOSPITAL_BASED_OUTPATIENT_CLINIC_OR_DEPARTMENT_OTHER): Payer: Self-pay | Admitting: Emergency Medicine

## 2018-03-30 ENCOUNTER — Emergency Department (HOSPITAL_BASED_OUTPATIENT_CLINIC_OR_DEPARTMENT_OTHER)
Admission: EM | Admit: 2018-03-30 | Discharge: 2018-03-30 | Disposition: A | Discharge status: Home / Self Care | Payer: Medicaid Other | Attending: Emergency Medicine | Admitting: Emergency Medicine

## 2018-03-30 DIAGNOSIS — Y92410 Unspecified street and highway as the place of occurrence of the external cause: Secondary | ICD-10-CM | POA: Diagnosis not present

## 2018-03-30 DIAGNOSIS — Y999 Unspecified external cause status: Secondary | ICD-10-CM | POA: Insufficient documentation

## 2018-03-30 DIAGNOSIS — Z79899 Other long term (current) drug therapy: Secondary | ICD-10-CM | POA: Insufficient documentation

## 2018-03-30 DIAGNOSIS — S161XXA Strain of muscle, fascia and tendon at neck level, initial encounter: Secondary | ICD-10-CM | POA: Insufficient documentation

## 2018-03-30 DIAGNOSIS — Y9389 Activity, other specified: Secondary | ICD-10-CM | POA: Insufficient documentation

## 2018-03-30 DIAGNOSIS — R51 Headache: Secondary | ICD-10-CM | POA: Insufficient documentation

## 2018-03-30 DIAGNOSIS — S1980XA Other specified injuries of unspecified part of neck, initial encounter: Secondary | ICD-10-CM | POA: Diagnosis present

## 2018-03-30 MED ORDER — CYCLOBENZAPRINE HCL 10 MG PO TABS: 10.0000 mg | ORAL_TABLET | Freq: Three times a day (TID) | ORAL | 0 refills | Status: AC | PRN

## 2018-03-30 NOTE — Discharge Instructions (Signed)
Ibuprofen 600 mg every 6 hours as needed for pain.  Flexeril as prescribed as needed for pain not relieved with ibuprofen. ° °Follow-up with your primary doctor if symptoms are not improving in the next week. °
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
